# Patient Record
Sex: Female | Born: 1935 | Race: White | Hispanic: No | Smoking: Never smoker
Health system: Southern US, Community
[De-identification: ages and names within clinical notes are randomized; demographics above are authoritative.]

## PROBLEM LIST (undated history)

## (undated) DIAGNOSIS — Z923 Personal history of irradiation: Secondary | ICD-10-CM

## (undated) DIAGNOSIS — G2581 Restless legs syndrome: Secondary | ICD-10-CM

## (undated) DIAGNOSIS — I10 Essential (primary) hypertension: Secondary | ICD-10-CM

## (undated) DIAGNOSIS — N920 Excessive and frequent menstruation with regular cycle: Secondary | ICD-10-CM

## (undated) DIAGNOSIS — C50919 Malignant neoplasm of unspecified site of unspecified female breast: Secondary | ICD-10-CM

## (undated) DIAGNOSIS — M722 Plantar fascial fibromatosis: Secondary | ICD-10-CM

## (undated) DIAGNOSIS — N393 Stress incontinence (female) (male): Secondary | ICD-10-CM

## (undated) DIAGNOSIS — M6530 Trigger finger, unspecified finger: Secondary | ICD-10-CM

## (undated) DIAGNOSIS — R0602 Shortness of breath: Secondary | ICD-10-CM

## (undated) DIAGNOSIS — M810 Age-related osteoporosis without current pathological fracture: Secondary | ICD-10-CM

## (undated) DIAGNOSIS — M7750 Other enthesopathy of unspecified foot: Secondary | ICD-10-CM

## (undated) DIAGNOSIS — M7070 Other bursitis of hip, unspecified hip: Secondary | ICD-10-CM

## (undated) DIAGNOSIS — G43909 Migraine, unspecified, not intractable, without status migrainosus: Secondary | ICD-10-CM

## (undated) DIAGNOSIS — K8020 Calculus of gallbladder without cholecystitis without obstruction: Secondary | ICD-10-CM

## (undated) HISTORY — DX: Other enthesopathy of unspecified foot and ankle: M77.50

## (undated) HISTORY — DX: Plantar fascial fibromatosis: M72.2

## (undated) HISTORY — DX: Stress incontinence (female) (male): N39.3

## (undated) HISTORY — PX: BLADDER SURGERY: SHX569

## (undated) HISTORY — DX: Restless legs syndrome: G25.81

## (undated) HISTORY — DX: Other bursitis of hip, unspecified hip: M70.70

## (undated) HISTORY — PX: CHOLECYSTECTOMY: SHX55

## (undated) HISTORY — DX: Trigger finger, unspecified finger: M65.30

## (undated) HISTORY — DX: Age-related osteoporosis without current pathological fracture: M81.0

## (undated) HISTORY — DX: Essential (primary) hypertension: I10

## (undated) HISTORY — PX: BREAST MASS EXCISION: SHX1267

## (undated) HISTORY — DX: Migraine, unspecified, not intractable, without status migrainosus: G43.909

## (undated) HISTORY — DX: Malignant neoplasm of unspecified site of unspecified female breast: C50.919

## (undated) HISTORY — PX: ABDOMINAL HYSTERECTOMY: SHX81

## (undated) HISTORY — DX: Calculus of gallbladder without cholecystitis without obstruction: K80.20

## (undated) HISTORY — PX: SHOULDER SURGERY: SHX246

## (undated) HISTORY — DX: Excessive and frequent menstruation with regular cycle: N92.0

---

## 1999-05-28 ENCOUNTER — Encounter: Admission: RE | Admit: 1999-05-28 | Discharge: 1999-05-28 | Payer: Self-pay | Admitting: Internal Medicine

## 1999-05-28 ENCOUNTER — Encounter: Payer: Self-pay | Admitting: Internal Medicine

## 1999-05-29 ENCOUNTER — Ambulatory Visit (HOSPITAL_COMMUNITY): Admission: RE | Admit: 1999-05-29 | Discharge: 1999-05-29 | Payer: Self-pay | Admitting: Internal Medicine

## 1999-07-12 ENCOUNTER — Encounter: Admission: RE | Admit: 1999-07-12 | Discharge: 1999-07-12 | Payer: Self-pay | Admitting: Obstetrics and Gynecology

## 1999-07-12 ENCOUNTER — Encounter: Payer: Self-pay | Admitting: Obstetrics and Gynecology

## 2000-08-05 ENCOUNTER — Encounter: Payer: Self-pay | Admitting: Obstetrics and Gynecology

## 2000-08-05 ENCOUNTER — Encounter: Admission: RE | Admit: 2000-08-05 | Discharge: 2000-08-05 | Payer: Self-pay | Admitting: Obstetrics and Gynecology

## 2000-08-11 ENCOUNTER — Encounter: Admission: RE | Admit: 2000-08-11 | Discharge: 2000-08-11 | Payer: Self-pay | Admitting: Obstetrics and Gynecology

## 2000-08-11 ENCOUNTER — Encounter: Payer: Self-pay | Admitting: Obstetrics and Gynecology

## 2001-01-25 ENCOUNTER — Emergency Department (HOSPITAL_COMMUNITY): Admission: EM | Admit: 2001-01-25 | Discharge: 2001-01-25 | Payer: Self-pay | Admitting: Emergency Medicine

## 2001-01-25 ENCOUNTER — Encounter: Payer: Self-pay | Admitting: Emergency Medicine

## 2001-01-26 ENCOUNTER — Encounter (INDEPENDENT_AMBULATORY_CARE_PROVIDER_SITE_OTHER): Payer: Self-pay | Admitting: *Deleted

## 2001-01-26 ENCOUNTER — Ambulatory Visit (HOSPITAL_COMMUNITY): Admission: RE | Admit: 2001-01-26 | Discharge: 2001-01-27 | Payer: Self-pay | Admitting: General Surgery

## 2001-01-26 ENCOUNTER — Encounter (HOSPITAL_BASED_OUTPATIENT_CLINIC_OR_DEPARTMENT_OTHER): Payer: Self-pay | Admitting: General Surgery

## 2001-09-13 ENCOUNTER — Encounter: Admission: RE | Admit: 2001-09-13 | Discharge: 2001-09-13 | Payer: Self-pay | Admitting: Obstetrics and Gynecology

## 2001-09-13 ENCOUNTER — Encounter: Payer: Self-pay | Admitting: Obstetrics and Gynecology

## 2002-07-21 ENCOUNTER — Encounter: Payer: Self-pay | Admitting: Internal Medicine

## 2002-07-21 ENCOUNTER — Encounter: Admission: RE | Admit: 2002-07-21 | Discharge: 2002-07-21 | Payer: Self-pay | Admitting: Internal Medicine

## 2003-02-01 ENCOUNTER — Encounter: Admission: RE | Admit: 2003-02-01 | Discharge: 2003-02-01 | Payer: Self-pay | Admitting: Obstetrics and Gynecology

## 2003-02-01 ENCOUNTER — Encounter: Payer: Self-pay | Admitting: Obstetrics and Gynecology

## 2003-07-15 ENCOUNTER — Ambulatory Visit (HOSPITAL_COMMUNITY): Admission: RE | Admit: 2003-07-15 | Discharge: 2003-07-15 | Payer: Self-pay | Admitting: Orthopedic Surgery

## 2003-08-24 ENCOUNTER — Ambulatory Visit (HOSPITAL_BASED_OUTPATIENT_CLINIC_OR_DEPARTMENT_OTHER): Admission: RE | Admit: 2003-08-24 | Discharge: 2003-08-24 | Payer: Self-pay | Admitting: Orthopedic Surgery

## 2008-05-29 ENCOUNTER — Inpatient Hospital Stay (HOSPITAL_COMMUNITY): Admission: EM | Admit: 2008-05-29 | Discharge: 2008-06-02 | Payer: Self-pay | Admitting: Emergency Medicine

## 2008-05-31 ENCOUNTER — Encounter (INDEPENDENT_AMBULATORY_CARE_PROVIDER_SITE_OTHER): Payer: Self-pay | Admitting: Gastroenterology

## 2009-12-25 ENCOUNTER — Ambulatory Visit: Payer: Self-pay | Admitting: Oncology

## 2009-12-26 ENCOUNTER — Encounter: Admission: RE | Admit: 2009-12-26 | Discharge: 2009-12-26 | Payer: Self-pay | Admitting: Internal Medicine

## 2010-01-02 LAB — COMPREHENSIVE METABOLIC PANEL
ALT: 24 U/L (ref 0–35)
AST: 19 U/L (ref 0–37)
Albumin: 4.5 g/dL (ref 3.5–5.2)
Calcium: 9.7 mg/dL (ref 8.4–10.5)
Chloride: 100 mEq/L (ref 96–112)
Potassium: 4.2 mEq/L (ref 3.5–5.3)
Sodium: 137 mEq/L (ref 135–145)

## 2010-01-02 LAB — CBC WITH DIFFERENTIAL/PLATELET
BASO%: 0.7 % (ref 0.0–2.0)
EOS%: 1.6 % (ref 0.0–7.0)
HGB: 13.2 g/dL (ref 11.6–15.9)
MCH: 31.1 pg (ref 25.1–34.0)
MCHC: 34.4 g/dL (ref 31.5–36.0)
RBC: 4.23 10*6/uL (ref 3.70–5.45)
RDW: 12.8 % (ref 11.2–14.5)
lymph#: 1.9 10*3/uL (ref 0.9–3.3)

## 2010-01-11 ENCOUNTER — Ambulatory Visit (HOSPITAL_COMMUNITY): Admission: RE | Admit: 2010-01-11 | Discharge: 2010-01-11 | Payer: Self-pay | Admitting: Oncology

## 2010-01-16 ENCOUNTER — Ambulatory Visit: Admission: RE | Admit: 2010-01-16 | Discharge: 2010-02-08 | Payer: Self-pay | Admitting: Radiation Oncology

## 2010-03-01 ENCOUNTER — Ambulatory Visit: Payer: Self-pay | Admitting: Oncology

## 2010-03-05 LAB — CBC WITH DIFFERENTIAL/PLATELET
BASO%: 0.7 % (ref 0.0–2.0)
EOS%: 1.3 % (ref 0.0–7.0)
MCH: 31.3 pg (ref 25.1–34.0)
MCHC: 35 g/dL (ref 31.5–36.0)
RDW: 12.8 % (ref 11.2–14.5)
lymph#: 2.2 10*3/uL (ref 0.9–3.3)

## 2010-03-06 LAB — COMPREHENSIVE METABOLIC PANEL
ALT: 31 U/L (ref 0–35)
AST: 24 U/L (ref 0–37)
Albumin: 4.3 g/dL (ref 3.5–5.2)
Calcium: 9.6 mg/dL (ref 8.4–10.5)
Chloride: 100 mEq/L (ref 96–112)
Creatinine, Ser: 1.29 mg/dL — ABNORMAL HIGH (ref 0.40–1.20)
Potassium: 3.8 mEq/L (ref 3.5–5.3)
Sodium: 137 mEq/L (ref 135–145)

## 2010-04-16 ENCOUNTER — Ambulatory Visit: Payer: Self-pay | Admitting: Oncology

## 2010-04-18 LAB — CBC WITH DIFFERENTIAL/PLATELET
Basophils Absolute: 0 10*3/uL (ref 0.0–0.1)
EOS%: 1.5 % (ref 0.0–7.0)
LYMPH%: 25.7 % (ref 14.0–49.7)
MCH: 31.3 pg (ref 25.1–34.0)
MCV: 89.8 fL (ref 79.5–101.0)
MONO%: 8.8 % (ref 0.0–14.0)
Platelets: 330 10*3/uL (ref 145–400)
RBC: 4.04 10*6/uL (ref 3.70–5.45)
RDW: 12.6 % (ref 11.2–14.5)

## 2010-04-18 LAB — COMPREHENSIVE METABOLIC PANEL
AST: 25 U/L (ref 0–37)
Albumin: 4.4 g/dL (ref 3.5–5.2)
Alkaline Phosphatase: 71 U/L (ref 39–117)
BUN: 25 mg/dL — ABNORMAL HIGH (ref 6–23)
Potassium: 3.8 mEq/L (ref 3.5–5.3)
Total Bilirubin: 0.3 mg/dL (ref 0.3–1.2)

## 2010-05-19 DIAGNOSIS — Z923 Personal history of irradiation: Secondary | ICD-10-CM

## 2010-05-19 HISTORY — PX: BREAST LUMPECTOMY: SHX2

## 2010-05-19 HISTORY — DX: Personal history of irradiation: Z92.3

## 2010-06-05 ENCOUNTER — Encounter
Admission: RE | Admit: 2010-06-05 | Discharge: 2010-06-05 | Payer: Self-pay | Source: Home / Self Care | Attending: General Surgery | Admitting: General Surgery

## 2010-06-08 ENCOUNTER — Encounter: Payer: Self-pay | Admitting: Orthopedic Surgery

## 2010-06-17 ENCOUNTER — Other Ambulatory Visit (HOSPITAL_COMMUNITY): Payer: Self-pay | Admitting: General Surgery

## 2010-06-17 DIAGNOSIS — C50919 Malignant neoplasm of unspecified site of unspecified female breast: Secondary | ICD-10-CM

## 2010-06-20 ENCOUNTER — Encounter (HOSPITAL_BASED_OUTPATIENT_CLINIC_OR_DEPARTMENT_OTHER)
Admission: RE | Admit: 2010-06-20 | Discharge: 2010-06-20 | Disposition: A | Discharge status: Home / Self Care | Payer: Medicare Other | Source: Ambulatory Visit | Attending: General Surgery | Admitting: General Surgery

## 2010-06-20 DIAGNOSIS — Z01812 Encounter for preprocedural laboratory examination: Secondary | ICD-10-CM | POA: Insufficient documentation

## 2010-06-20 DIAGNOSIS — Z01810 Encounter for preprocedural cardiovascular examination: Secondary | ICD-10-CM | POA: Insufficient documentation

## 2010-06-20 LAB — BASIC METABOLIC PANEL
BUN: 19 mg/dL (ref 6–23)
Calcium: 9.2 mg/dL (ref 8.4–10.5)
Creatinine, Ser: 1.02 mg/dL (ref 0.4–1.2)
GFR calc non Af Amer: 53 mL/min — ABNORMAL LOW (ref >60)
Glucose, Bld: 110 mg/dL — ABNORMAL HIGH (ref 70–99)
Sodium: 136 mEq/L (ref 135–145)

## 2010-06-20 LAB — DIFFERENTIAL
Basophils Absolute: 0.1 10*3/uL (ref 0.0–0.1)
Eosinophils Relative: 1 % (ref 0–5)
Lymphocytes Relative: 35 % (ref 12–46)
Monocytes Absolute: 0.5 10*3/uL (ref 0.1–1.0)

## 2010-06-20 LAB — CBC
HCT: 36.2 % (ref 36.0–46.0)
MCHC: 33.4 g/dL (ref 30.0–36.0)
RDW: 12.5 % (ref 11.5–15.5)

## 2010-06-24 ENCOUNTER — Ambulatory Visit (HOSPITAL_COMMUNITY)
Admission: RE | Admit: 2010-06-24 | Discharge: 2010-06-24 | Disposition: A | Discharge status: Home / Self Care | Payer: Medicare Other | Source: Ambulatory Visit | Attending: General Surgery | Admitting: General Surgery

## 2010-06-24 ENCOUNTER — Other Ambulatory Visit: Payer: Self-pay | Admitting: General Surgery

## 2010-06-24 ENCOUNTER — Ambulatory Visit (HOSPITAL_BASED_OUTPATIENT_CLINIC_OR_DEPARTMENT_OTHER)
Admission: RE | Admit: 2010-06-24 | Discharge: 2010-06-24 | Disposition: A | Discharge status: Home / Self Care | Payer: Medicare Other | Source: Ambulatory Visit | Attending: General Surgery | Admitting: General Surgery

## 2010-06-24 DIAGNOSIS — C50919 Malignant neoplasm of unspecified site of unspecified female breast: Secondary | ICD-10-CM

## 2010-06-24 DIAGNOSIS — C50519 Malignant neoplasm of lower-outer quadrant of unspecified female breast: Secondary | ICD-10-CM | POA: Insufficient documentation

## 2010-06-24 MED ORDER — TECHNETIUM TC 99M SULFUR COLLOID FILTERED
1.0000 | Freq: Once | INTRAVENOUS | Status: AC | PRN
  Administered 2010-06-24: 1 via INTRADERMAL

## 2010-06-30 NOTE — Op Note (Addendum)
[name]                  ACCOUNT NO.:  [id number]  MEDICAL RECORD NO.:  [id number]           PATIENT TYPE:  LOCATION:                                 FACILITY:  PHYSICIAN:  Juanetta Gosling, MDDATE OF BIRTH:  01-15-1936  DATE OF PROCEDURE: DATE OF DISCHARGE:                              OPERATIVE REPORT   PREOPERATIVE DIAGNOSIS:  Clinical stage II left breast cancer status post neoadjuvant endocrine therapy.  POSTOPERATIVE DIAGNOSIS:  Clinical stage II left breast cancer status post neoadjuvant endocrine therapy.  PROCEDURES: 1. Left breast wire-guided lumpectomy. 2. Injection of methylene blue dye for sentinel lymph node     identification. 3. Left axillary sentinel node biopsy.  SURGEON:  Juanetta Gosling, MD.  ASSISTANT:  None.  ANESTHESIOLOGIST:  Zenon Mayo, MD  SPECIMENS: 1. Left breast tissue marked with paint kit. 2. Left axillary sentinel node with a count of 305.  DISPOSITION OF SPECIMENS:  Pathology.  ESTIMATED BLOOD LOSS:  Minimal.  COMPLICATIONS:  None.  DRAINS:  None.  DISPOSITION:  To recovery room in stable condition.  INDICATIONS:  [name] is a 75 year old female.  I initially saw her in August 2011 after screening mammogram showed an abnormality in the left breast.  She has undergone neoadjuvant Femara with Dr. Pierce Crane.  She has had a Good clinical response to this.  Her MRI response has been fairly decent as well.  We discussed taking her to the operating room, and she and I discussed breast conservation therapy with the risks and benefits associated with that procedure.  PROCEDURE: After informed consent was obtained, the patient was first taken to the breast center.  I discussed with Dr. Yolanda Bonine placing the wires beforehand, and there was a smaller ultrasound abnormality but a little bit larger MRI and mammographic abnormality and two wires were placed to bracket this area with the clip being right at the  center of these areas.  She was then brought to [facility].  She was administered 400 mg of IV ciprofloxacin due to a PENICILLIN allergy.  She then underwent administration of technetium in a periareolar fashion.  She was then taken to the operating room, placed under general anesthesia with an LMA.  Her left breast and axilla were then prepped and draped in a standard sterile surgical fashion.  A surgical time-out was then performed.  The wires were both in her lower outer quadrant of her breast.  I made a radial incision just away from the wires and then brought the wires in from their remote position into the incision.  I then used cautery to excise the area but that just outside of the wires and the entire area between the wires in one single specimen.  This was taken down all the way to the pectoralis muscle including the pectoralis fascia.  Faxitron mammogram was then taken which confirmed removal of the clip, as well as the entire mammographic density, and it was right in the middle of the specimen.  I then placed two sponges in this cavity and then approached the axilla.   I made  a 2-cm incision just below the hairline in her axilla.  I used cautery to go through the axillary fascia.  I used the Neoprobe to identify one sentinel node with a count of 305, the background count was less than 5.  Upon completion, I could not identify any blue dye in the axilla.  I then placed a sponge in the axilla. There was a small area that was bleeding that I controlled with cautery. I then lifted the breast off the pectoralis muscle and then closed the deep layer of the breast tissue with 2-0 Vicryl.  I placed two clips in the deep position, and I placed one clip in each cardinal position of the lumpectomy cavity.  I then closed the breast in multiple layers and used a 4-0 Monocryl to close the skin.  I then took my sponge out of the axilla.  There was no other bleeding.  I closed the  axillary fascia with a 2-0 Vicryl, the dermis with a 3-0 Vicryl, the skin with a 4-0 Monocryl in a subcuticular fashion.  I injected 20 mL of 0.25% Marcaine upon completion.  I placed Benzoin, Steri-Strips, and sterile dressings overlying both of these incisions.  She tolerated this well, was extubated in the operating room, and transferred to recovery room in stable condition.     Juanetta Gosling, MD     MCW/MEDQ  D:  06/24/2010  T:  06/25/2010  Job:  [id number]  cc:   Pierce Crane, MD Theressa Millard, M.D.  Electronically Signed by [name] MD on 06/29/2010 03:08:56 PM

## 2010-07-11 ENCOUNTER — Ambulatory Visit (HOSPITAL_COMMUNITY): Payer: Medicare Other

## 2010-07-11 ENCOUNTER — Ambulatory Visit (HOSPITAL_COMMUNITY)
Admission: RE | Admit: 2010-07-11 | Discharge: 2010-07-11 | Disposition: A | Discharge status: Home / Self Care | Payer: Medicare Other | Source: Ambulatory Visit | Attending: General Surgery | Admitting: General Surgery

## 2010-07-11 ENCOUNTER — Other Ambulatory Visit: Payer: Self-pay | Admitting: General Surgery

## 2010-07-11 DIAGNOSIS — Z01812 Encounter for preprocedural laboratory examination: Secondary | ICD-10-CM | POA: Insufficient documentation

## 2010-07-11 DIAGNOSIS — Z79899 Other long term (current) drug therapy: Secondary | ICD-10-CM | POA: Insufficient documentation

## 2010-07-11 DIAGNOSIS — C50919 Malignant neoplasm of unspecified site of unspecified female breast: Secondary | ICD-10-CM | POA: Insufficient documentation

## 2010-07-11 DIAGNOSIS — Z01818 Encounter for other preprocedural examination: Secondary | ICD-10-CM | POA: Insufficient documentation

## 2010-07-11 DIAGNOSIS — I10 Essential (primary) hypertension: Secondary | ICD-10-CM | POA: Insufficient documentation

## 2010-07-11 LAB — CBC
HCT: 35.7 % — ABNORMAL LOW (ref 36.0–46.0)
Hemoglobin: 12 g/dL (ref 12.0–15.0)
MCH: 30.2 pg (ref 26.0–34.0)
MCHC: 33.6 g/dL (ref 30.0–36.0)
MCV: 89.9 fL (ref 78.0–100.0)
RBC: 3.97 MIL/uL (ref 3.87–5.11)

## 2010-07-11 LAB — BASIC METABOLIC PANEL
BUN: 21 mg/dL (ref 6–23)
CO2: 25 mEq/L (ref 19–32)
Calcium: 9.6 mg/dL (ref 8.4–10.5)
Chloride: 102 mEq/L (ref 96–112)
Creatinine, Ser: 1.01 mg/dL (ref 0.4–1.2)
GFR calc Af Amer: >60 mL/min (ref >60)
Glucose, Bld: 105 mg/dL — ABNORMAL HIGH (ref 70–99)

## 2010-07-17 NOTE — Op Note (Signed)
NAMEJAYLEI, [name]                  ACCOUNT NO.:  [id number]  MEDICAL RECORD NO.:  [id number]           PATIENT TYPE:  O  LOCATION:  SDSC                         FACILITY:  [facility]  PHYSICIAN:  Juanetta Gosling, MDDATE OF BIRTH:  07/03/1935  DATE OF PROCEDURE: DATE OF DISCHARGE:  07/11/2010                              OPERATIVE REPORT   PREOPERATIVE DIAGNOSIS:  Stage I left breast cancer with positive superior margin after lumpectomy and sentinel node biopsy.  POSTOPERATIVE DIAGNOSIS:  Stage I left breast cancer with positive superior margin after lumpectomy and sentinel node biopsy.  PROCEDURE:  Re-excision left breast superior margin.  SURGEON:  Juanetta Gosling, MD  ASSISTANT:  None.  ANESTHESIA:  General.  SPECIMENS:  Left breast superior margin marked with paint kit.  SPECIMEN:  Pathology.  ESTIMATED BLOOD LOSS:  Minimal.  COMPLICATIONS:  None.  DRAINS:  None.  DISPOSITION:  To recovery room in stable condition.  INDICATIONS:  Mrs. [name] is a 75 year old female who was noted to have a left breast cancer and underwent neoadjuvant Femara, I then took to the operating room on June 24, 2010, did a left breast wire-guided lumpectomy with brackets and a sentinel node biopsy.  Her pathology returned as a stage I left breast cancer with 3 foci of invasive carcinoma and associated DCIS, which focally involved her superior margin.  She and I discussed her options at this point and being a re- excision attempt at a lumpectomy versus mastectomy.  Both of [place] agreed upon trying to re-excise this to get clear margins and we discussed re- excising her superior margin.  PROCEDURE:  After informed consent was obtained and the patient was taken to the operative room.  She was administered 1 g of intravenous cefazolin.  Sequential compression devices were placed on her lower extremities prior to induction of anesthesia, she was then placed under general anesthesia with an  LMA without complication.  Her left breast was then prepped and draped in standard sterile surgical fashion. Surgical time-out was then performed.  I reentered her old incision.  I had closed a lot of her breast tissue down in the cavity.  I then removed all these stitches.  I was unable to identify the area where superior margin was, I grasped this with Allis clamps, used cautery to remove the entire superior margin.  I then placed the clip on the superior margin as well as two other clips on the deep margin as well.  I used the standard paint kit to mark the specimen.  I then irrigated and observed hemostasis.  I then closed down the breast and multiple layers with 2-0 Vicryl, 3-0 Vicryl, and 4-0 Monocryl to close the skin.  Steri-Strips and sterile dressing were placed over this.  A breast binder was then placed overlying all of this.  She tolerated this well, was extubated in the operating room and transferred to the recovery room in stable condition.     Juanetta Gosling, MD     MCW/MEDQ  D:  07/11/2010  T:  07/11/2010  Job:  [id number]  cc:   Theron Arista  Donnie Coffin, MD Theressa Millard, M.D.  Electronically Signed by [name] MD on 07/17/2010 04:07:21 PM

## 2010-08-07 ENCOUNTER — Ambulatory Visit: Payer: Medicare Other | Attending: Radiation Oncology | Admitting: Radiation Oncology

## 2010-08-07 DIAGNOSIS — C50919 Malignant neoplasm of unspecified site of unspecified female breast: Secondary | ICD-10-CM | POA: Insufficient documentation

## 2010-08-07 DIAGNOSIS — Z170 Estrogen receptor positive status [ER+]: Secondary | ICD-10-CM | POA: Insufficient documentation

## 2010-08-07 DIAGNOSIS — Z510 Encounter for antineoplastic radiation therapy: Secondary | ICD-10-CM | POA: Insufficient documentation

## 2010-08-22 ENCOUNTER — Encounter (HOSPITAL_BASED_OUTPATIENT_CLINIC_OR_DEPARTMENT_OTHER): Payer: Medicare Other | Admitting: Oncology

## 2010-08-22 ENCOUNTER — Other Ambulatory Visit: Payer: Self-pay | Admitting: Oncology

## 2010-08-22 DIAGNOSIS — Z170 Estrogen receptor positive status [ER+]: Secondary | ICD-10-CM

## 2010-08-22 DIAGNOSIS — C50919 Malignant neoplasm of unspecified site of unspecified female breast: Secondary | ICD-10-CM

## 2010-08-22 LAB — COMPREHENSIVE METABOLIC PANEL
ALT: 32 U/L (ref 0–35)
Albumin: 4.4 g/dL (ref 3.5–5.2)
CO2: 29 mEq/L (ref 19–32)
Calcium: 10.1 mg/dL (ref 8.4–10.5)
Chloride: 98 mEq/L (ref 96–112)
Potassium: 3.5 mEq/L (ref 3.5–5.3)
Sodium: 138 mEq/L (ref 135–145)
Total Bilirubin: 0.3 mg/dL (ref 0.3–1.2)
Total Protein: 6.6 g/dL (ref 6.0–8.3)

## 2010-08-22 LAB — CBC WITH DIFFERENTIAL/PLATELET
BASO%: 0.5 % (ref 0.0–2.0)
Eosinophils Absolute: 0.1 10*3/uL (ref 0.0–0.5)
MCHC: 34 g/dL (ref 31.5–36.0)
MONO#: 0.6 10*3/uL (ref 0.1–0.9)
NEUT#: 6.3 10*3/uL (ref 1.5–6.5)
RBC: 4.34 10*6/uL (ref 3.70–5.45)
WBC: 8.7 10*3/uL (ref 3.9–10.3)
lymph#: 1.7 10*3/uL (ref 0.9–3.3)

## 2010-09-02 LAB — CBC
HCT: 29.2 % — ABNORMAL LOW (ref 36.0–46.0)
HCT: 30.8 % — ABNORMAL LOW (ref 36.0–46.0)
Hemoglobin: 10.3 g/dL — ABNORMAL LOW (ref 12.0–15.0)
Hemoglobin: 9.9 g/dL — ABNORMAL LOW (ref 12.0–15.0)
MCHC: 33.3 g/dL (ref 30.0–36.0)
MCHC: 34 g/dL (ref 30.0–36.0)
MCV: 89.1 fL (ref 78.0–100.0)
MCV: 89.6 fL (ref 78.0–100.0)
MCV: 90.7 fL (ref 78.0–100.0)
Platelets: 237 10*3/uL (ref 150–400)
Platelets: 246 10*3/uL (ref 150–400)
Platelets: 324 10*3/uL (ref 150–400)
RBC: 3.28 MIL/uL — ABNORMAL LOW (ref 3.87–5.11)
RBC: 3.4 MIL/uL — ABNORMAL LOW (ref 3.87–5.11)
RDW: 13.1 % (ref 11.5–15.5)
RDW: 13.2 % (ref 11.5–15.5)
WBC: 15.2 10*3/uL — ABNORMAL HIGH (ref 4.0–10.5)
WBC: 6.4 10*3/uL (ref 4.0–10.5)
WBC: 7.6 10*3/uL (ref 4.0–10.5)

## 2010-09-02 LAB — URINE CULTURE: Colony Count: >=100000

## 2010-09-02 LAB — BASIC METABOLIC PANEL
BUN: 3 mg/dL — ABNORMAL LOW (ref 6–23)
BUN: 6 mg/dL (ref 6–23)
CO2: 23 mEq/L (ref 19–32)
CO2: 24 mEq/L (ref 19–32)
Calcium: 7.8 mg/dL — ABNORMAL LOW (ref 8.4–10.5)
Calcium: 7.9 mg/dL — ABNORMAL LOW (ref 8.4–10.5)
Chloride: 109 mEq/L (ref 96–112)
Chloride: 111 mEq/L (ref 96–112)
Creatinine, Ser: 0.8 mg/dL (ref 0.4–1.2)
Creatinine, Ser: 0.84 mg/dL (ref 0.4–1.2)
GFR calc Af Amer: >60 mL/min (ref >60)
GFR calc Af Amer: >60 mL/min (ref >60)
GFR calc non Af Amer: >60 mL/min (ref >60)
GFR calc non Af Amer: >60 mL/min (ref >60)
Glucose, Bld: 86 mg/dL (ref 70–99)
Glucose, Bld: 92 mg/dL (ref 70–99)
Potassium: 3.5 mEq/L (ref 3.5–5.1)
Potassium: 3.8 mEq/L (ref 3.5–5.1)
Sodium: 139 mEq/L (ref 135–145)
Sodium: 140 mEq/L (ref 135–145)

## 2010-09-02 LAB — CLOSTRIDIUM DIFFICILE EIA
C difficile Toxins A+B, EIA: NEGATIVE
C difficile Toxins A+B, EIA: NEGATIVE

## 2010-09-02 LAB — URINE MICROSCOPIC-ADD ON

## 2010-09-02 LAB — URINALYSIS, ROUTINE W REFLEX MICROSCOPIC
Glucose, UA: NEGATIVE mg/dL
Ketones, ur: NEGATIVE mg/dL
pH: 6 (ref 5.0–8.0)

## 2010-09-02 LAB — DIFFERENTIAL
Basophils Absolute: 0 10*3/uL (ref 0.0–0.1)
Eosinophils Relative: 0 % (ref 0–5)
Lymphocytes Relative: 6 % — ABNORMAL LOW (ref 12–46)
Lymphs Abs: 1 10*3/uL (ref 0.7–4.0)
Monocytes Absolute: 1.4 10*3/uL — ABNORMAL HIGH (ref 0.1–1.0)

## 2010-09-02 LAB — PHOSPHORUS: Phosphorus: 1.6 mg/dL — ABNORMAL LOW (ref 2.3–4.6)

## 2010-09-02 LAB — STOOL CULTURE

## 2010-09-02 LAB — COMPREHENSIVE METABOLIC PANEL
AST: 25 U/L (ref 0–37)
Albumin: 3.3 g/dL — ABNORMAL LOW (ref 3.5–5.2)
Chloride: 94 mEq/L — ABNORMAL LOW (ref 96–112)
Creatinine, Ser: 1.08 mg/dL (ref 0.4–1.2)
GFR calc Af Amer: >60 mL/min (ref >60)
Sodium: 131 mEq/L — ABNORMAL LOW (ref 135–145)
Total Bilirubin: 0.8 mg/dL (ref 0.3–1.2)

## 2010-09-02 LAB — CULTURE, BLOOD (ROUTINE X 2): Culture: NO GROWTH

## 2010-09-02 LAB — MAGNESIUM: Magnesium: 2 mg/dL (ref 1.5–2.5)

## 2010-10-01 NOTE — Op Note (Signed)
[name], [name]                  ACCOUNT NO.:  [id number]   MEDICAL RECORD NO.:  [id number]          PATIENT TYPE:  INP   LOCATION:  2035                         FACILITY:  [facility]   PHYSICIAN:  John C. Madilyn Fireman, M.D.    DATE OF BIRTH:  1936-01-05   DATE OF PROCEDURE:  05/31/2008  DATE OF DISCHARGE:                               OPERATIVE REPORT   INDICATIONS FOR PROCEDURE:  Unrelenting diarrhea, uncertain etiology.   PROCEDURE:  The patient was placed in the left lateral decubitus  position and placed on the pulse monitor with continuous low-flow oxygen  delivered by nasal cannula.  She was sedated with 75 mcg IV fentanyl and  6 mg IV Versed.  Olympus video colonoscope was inserted into the rectum  and advanced to the cecum, confirmed by transillumination of McBurney's  point and visualization of ileocecal valve and appendiceal orifice.  The  prep was excellent, despite no prep being given.  The terminal ileum was  intubated and explored for several centimeters and appeared to be within  normal limits.  The cecum and ascending colon appeared normal with no  masses, polyps, diverticula, or other mucosal abnormalities.  Beginning  at about the transverse colon, there were a few areas of mild patchy  erythema overlying rugal folds.  This was somewhat more pronounced than  the sigmoid and rectum.  It appeared at best to represent mild visible  inflammation.  I elected to take biopsies in 2 specimen containers  primarily of normal mucosa in the ascending and transverse colon to rule  out a microscopic colitis.  Additional biopsies were taken of the  erythematous area in the sigmoid and rectum and send the separate  specimen container.  There were no pseudomembranes.  No friability,  granularity, or thickening in any of these areas, and overall the  appearance was very mild and nonspecific.  The scope was then withdrawn  and the patient returned to the recovery room in stable condition.   She  tolerated the procedure well, and there were no immediate complications.   IMPRESSION:  Possible mild colitis, mainly patchy in the sigmoid and  rectosigmoid and descending colon.   PLAN:  Await all biopsy results.           ______________________________  Everardo All Madilyn Fireman, M.D.     JCH/MEDQ  D:  05/31/2008  T:  05/31/2008  Job:  [id number]

## 2010-10-01 NOTE — Discharge Summary (Signed)
NAMEDEVYNN, [name]                  ACCOUNT NO.:  [id number]   MEDICAL RECORD NO.:  [id number]          PATIENT TYPE:  INP   LOCATION:  5532                         FACILITY:  [facility]   PHYSICIAN:  Theressa Millard, M.D.    DATE OF BIRTH:  1935/09/19   DATE OF ADMISSION:  05/28/2008  DATE OF DISCHARGE:  06/02/2008                               DISCHARGE SUMMARY   ADMITTING DIAGNOSES:  Diarrhea and profound dehydration.   DISCHARGE DIAGNOSES:  1. Antibiotic-associated colitis, no evidence of Clostridium      difficile.  2. Profound volume contraction with hypokalemia and hyponatremia.  3. Hypertension.   The patient is a 75 year old white female, who had had severe diarrhea  for 3-4 weeks after several rounds of antibiotics.  She finally came to  the emergency room and was mildly hypotensive.   HOSPITAL COURSE:  The patient underwent a CT scan of the abdomen, which  revealed no significant abnormalities.  She was given IV fluids, which  resulted in improvement in her blood pressure and a general sense of  well being.  A hyponatremia with a sodium of 131 and hypokalemia with a  potassium of 2.9, improved with IV fluids.  C. difficile titer was  checked on 3 different occasions.  The patient was started on  metronidazole.  The patient's symptoms improved, but she continued to  have 6-8 stools per day.  C. difficile stool tests were all negative.  She underwent a colonoscopy, which showed no evidence of pseudomembranes  or significant evidence of inflammation.  Biopsy showed minor  inflammation.  She continued to improve, was able to eat a regular diet,  and was discharged in improved condition.   DISCHARGE MEDICATIONS:  1. Maxzide 25 once daily.  2. Calcium once daily, start in 2 weeks.  3. Multivitamin daily, start in 2 weeks.  4. Metronidazole 3 times a day x4 days.  5. Align per box instructions, available over the counter.   FOLLOWUP:  She will call to make an appointment to see  me in 1-2 weeks.   She will avoid milk products for the next week.      Theressa Millard, M.D.  Electronically Signed     JO/MEDQ  D:  06/02/2008  T:  06/03/2008  Job:  [id number]

## 2010-10-01 NOTE — H&P (Signed)
NAMEJIL, [name] NO.:  [id number]   MEDICAL RECORD NO.:  [id number]          PATIENT TYPE:  EMS   LOCATION:  MAJO                         FACILITY:  [facility]   PHYSICIAN:  Lucita Ferrara, MD         DATE OF BIRTH:  [birth year]   DATE OF ADMISSION:  05/28/2008  DATE OF DISCHARGE:                              HISTORY & PHYSICAL   PRIMARY CARE PHYSICIAN:  Dr. Earl Gala.   CHIEF COMPLAINT:  Persistent diarrhea and dizziness.   HISTORY OF PRESENT ILLNESS:  The patient is a 75 year old female who  presents with persistent diarrhea that started about 3 weeks ago.  Diarrheal symptoms started and were associated with antibiotics that she  had been taking for sinusitis and bronchitis.  Diarrhea is nonbloody,  non-foul-smelling, mucousy.  The patient has had colonoscopies in the  past.  She denies any food predilections.  She denies of clostridium  difficile infection.  She denies recent travel.  She denies any nausea,  vomiting.  Supposedly, she has been taking Augmentin.   ALLERGIES:  NO KNOWN DRUG ALLERGIES.   MEDICATIONS AT HOME:  Triamterene hydrochlorothiazide 37.5/25 once  daily.   PAST MEDICAL HISTORY:  Significant for hypertension.   PAST SURGICAL HISTORY:  1. Status post cholecystectomy.  2. Status post shoulder arthrogram.   SOCIAL HISTORY:  The patient denies drugs, alcohol or tobacco.  Travel  history:  None recently.   REVIEW OF SYSTEMS:  As per HPI, otherwise negative.  She denies any  chest pain, shortness of breath.  She denies hematemesis, hematochezia.  She does have some dizziness and lightheadedness upon ambulation.  She  denies any abdominal pain.   PHYSICAL EXAMINATION:  GENERAL:  The patient is in no acute distress.  Lowest blood pressure documented here was 91/51.  HEENT: Normocephalic, atraumatic.  Sclerae is anicteric.  NECK:  Supple.  No JVD.  Mucous membranes dry.  NECK:  Supple.  CARDIOVASCULAR:  S1, S2, regular rate and rhythm.  No  murmurs, rubs,  clicks.  LUNGS:  Clear to auscultation, without rubs or wheezes.  ABDOMEN:  Soft, nontender, nondistended.  Positive bowel sounds tremors.  EXTREMITIES:  No clubbing, cyanosis or edema.  NEUROLOGICAL:  Patient is oriented and oriented x3.  Cranial nerves II-  XII grossly intact.   EMERGENCY DEPARTMENT COURSE:  The patient could not tolerate ambulation.  She became dizzy and lightheaded.  EKG shows normal sinus rhythm.  Urinalysis large amount of leukocyte esterase.  Complete metabolic panel  shows sodium 131, potassium 2.9, BUN 14, creatinine 1.08.  AST, ALT and  alk phosphatase is within normal limits.  Lipase is 20.  White count  15.2, hemoglobin 13.6, hematocrit 40.9, platelet count 324.   ASSESSMENT/PLAN:  A 75 year old with:  1. Persistent foul-smelling diarrhea with dizziness and light      headedness upon ambulation.  2. Hypertension on triamterene/hydrochlorothiazide.   DISCUSSION:  The patient will be admitted to medical telemetry unit.  Intravenous fluids will be initiated.  The patient will be kept n.p.o.  Stool will be sent out for clostridium  difficile.  The patient should be  on C diff contact precautions.  The patient will be empirically started  on metronidazole 250 mg p.o. every 8 hours.  Will monitor white count.  GI will be consulted as needed.  Will need to hold her blood pressure  medication at this time.  We will go ahead and replete potassium via  intravenous fluids.  The rest of plans depend on progress.      Lucita Ferrara, MD  Electronically Signed     RR/MEDQ  D:  05/29/2008  T:  05/29/2008  Job:  [id number]

## 2010-10-04 NOTE — Op Note (Signed)
NAME:  [name], [name]                            ACCOUNT NO.:  [id number]   MEDICAL RECORD NO.:  [id number]                   PATIENT TYPE:  AMB   LOCATION:  DSC                                  FACILITY:  [facility]   PHYSICIAN:  Katy Fitch. Naaman Plummer., M.D.          DATE OF BIRTH:  02-Jun-1935   DATE OF PROCEDURE:  08/24/2003  DATE OF DISCHARGE:                                 OPERATIVE REPORT   PREOPERATIVE DIAGNOSIS:  Chronic stage III impingement left shoulder with  MRI evidence of full thickness rotator cuff tear of supraspinatus and  chronic acromioclavicular joint arthropathy.   POSTOPERATIVE DIAGNOSIS:  Chronic stage III impingement left shoulder with  MRI evidence of full thickness rotator cuff tear of supraspinatus and  chronic acromioclavicular joint arthropathy.   OPERATION:  1. Diagnostic arthroscopy of left glenohumeral joint followed by     arthroscopic subacromial decompression with coracoacromial ligament     release, bursectomy, and acromioplasty.  2. Open repair of right rotator cuff chronic tear of supraspinatus tendon.  3. Open distal clavicle excision, i.e. Mumford procedure.   SURGEON:  [name], M.D.   ASSISTANT:  Jonni Sanger, P.A.   ANESTHESIA:  General endotracheal anesthesia supplemented by interscalene  block.   ANESTHESIOLOGIST:  Sheldon Silvan, M.D.   INDICATIONS FOR PROCEDURE:  [name] is a 75 year old former employee of  the [employer] who presented for evaluation of chronic left shoulder  pain.  [name] is status post arthroscopic decompression of her right shoulder  with distal clavicle excision in 1999 with a good result.  [name] presented for  evaluation of her left shoulder reporting night pain, weakness of abduction,  and weakness of external rotation.  Clinical examination suggested  impingement.  Plain films documented AC arthropathy and a prominent lateral  acromion with a sclerotic greater tuberosity.  An MRI of her shoulder  documented a full thickness tear of her supraspinatus, a prominent lateral  acromion, and AC arthropathy.  Arrangements are made at this time for  diagnostic arthroscopy with appropriate glenohumeral intervention followed  by subacromial decompression, open distal clavicle resection, and repair of  the rotator cuff.  Prior to surgery, questions were invited and answered.  Dr. Ivin Booty performed a preoperative anesthesia consult and proceeded with a  scalene block in the holding area.  Anesthesia was noted to be excellent in  the left four quarter.   PROCEDURE:  [name] is brought to the operating room and placed in supine  position on the operating table.  Following the induction of general  endotracheal anesthesia, [name] was carefully positioned in the beach chair  position with the aid of a torso and head holder designed for shoulder  arthroscopy.  The entire left upper extremity and four quarter were prepped  with DuraPrep followed by draping with impervious arthroscopy drapes.  The  procedure commenced with placement of the arthroscope through a standard  posterior portal with blunt technique.  Diagnostic arthroscopy revealed  intact hyaline articular cartilage surfaces on the humeral head and glenoid.  The labrum was intact.  The anterior glenohumeral ligaments were intact.  The biceps anchor was solid and the biceps tendon was normal to the rotator  interval.  The subscapularis was normal.  The deep surface of the rotator  cuff revealed an intact capsular membrane.  The supraspinatus defect was  visible through the membrane.  The remainder of the glenohumeral examination  was normal.  The scope was removed from the glenohumeral joint and placed in  the subacromial space.   The subacromial and subdeltoid bursa was moderately hypertrophic.  This was  thoroughly debrided with a suction shaver followed by release of the  coracoacromial ligament.  A bur was used to then level the acromion  to a  type 1 morphology.  The arthroscopic equipment was removed.   An open incision was created from the distal clavicle across the anterior  acromion.  The anterior third of the deltoid was released subperiosteally  exposing the cuff and bursa.  Several loose bodies were removed from the  bursa followed by debridement of the margins of the cuff tear.  The greater  tuberosity profile was lowered 3 mm with a power bur followed by placement  of a 5 mm bio-corkscrew anchor.  A #2 FiberWire suture was placed with  grasping technique and used to advance the supraspinatus to an anatomic  position with Merlinda Frederick type through bone suture anchorage.  The four tails  of the bio-corkscrew anchor were then placed as double mattress sutures  reapproximating the supraspinatus to the decorticated greater tuberosity.  The bursa was then carefully palpated and no other loose bodies were noted.  The distal 18 mm of clavicle was exposed and excised using an oscillating  saw.  A marginal osteophyte along the medial lip of the [facility] joint acromial  side was removed with a rongeur.  A hand rasp was used to smooth the deep  side of the acromion followed by repair of the anterior third of the deltoid  to the trapezius muscle closing the dead space created by distal clavicle  resection and the anterior third of the deltoid was repaired anatomically  with aponeurosis sutures of #2 FiberWire.   The skin was then repaired with subdermal suture of 2-0 Vicryl and  intradermal 3-0 Prolene with Steri-Strips.  There were no apparent  complications.  [name] tolerated the surgery and anesthesia well.  [name]  was transferred to the recovery room with stable vital signs.   For aftercare, [name] will admitted to the recovery care center for observation  of her vital signs and appropriate analgesics in the form of IV PCA morphine  and p.o. and IV Dilaudid.  [name] will be given IV Ancef 1 gram IV q.8h. X 24  hours.                                               Katy Fitch Naaman Plummer., M.D.    RVS/MEDQ  D:  08/24/2003  T:  08/24/2003  Job:  [id number]

## 2010-10-04 NOTE — Op Note (Signed)
[facility]  Patient:    SANDA, DEJOY Visit Number: [id number] MRN: [id number]          Service Type: DSU Location: [facility]CRM 2550 08 Attending Physician:  Sonda Primes Proc. Date: 01/26/01 Admit Date:  01/26/2001   CC:         Luisa Hart L. Lurene Shadow, M.D. x 2   Operative Report  PREOPERATIVE DIAGNOSIS:  Acute cholecystitis.  POSTOPERATIVE DIAGNOSIS:  Acute cholecystitis.  PROCEDURE:  Laparoscopic cholecystectomy with intraoperative cholangiogram.  SURGEON:  Luisa Hart L. Lurene Shadow, M.D.  ASSISTANT:  Marnee Spring. Wiliam Ke, M.D.  ANESTHESIA:  General anesthesia.  INDICATIONS:  The patient is a 75 year old woman presenting with acute epigastric and right upper quadrant pain associated with nausea and vomiting. Gallbladder ultrasound demonstrates cholelithiasis.  Liver function tests and lipase are all within normal limits.  Because of her persistent pain, she is now brought to the operating room for a laparoscopic cholecystectomy.  DESCRIPTION OF PROCEDURE:  Following the induction of satisfactory general anesthesia, with the patient positioned supinely, the abdomen was routinely prepped and draped to be included in a sterile operative field.  Open laparoscopy was created at the umbilicus with insertion of the Hasson cannula and insufflation of the peritoneal cavity with 14 mmHg pressure using carbon dioxide.  The camera was inserted and visual exploration carried out.  The gallbladder was noted to be somewhat hydropic and acutely inflammed.  The duodenal sweep and the anterior gastric wall appeared to be normal.  Liver edges were sharp and liver surfaces smooth.  None of the large and small intestines appeared to be abnormal.   No adnexal structures were visualized.  Under direct vision, epigastric and lateral ports were placed.  The gallbladder is grasped and retracted cephalad and dissection carried down to the region of the ampulla with isolation  of the cystic artery and cystic duct. The cystic artery was traced into its entry into the gallbladder wall.  The cystic duct was traced to the gallbladder and cystic duct junction.  The cystic artery was doubly clipped and transected.  The cystic duct was clipped proximally and opened.  The cystic duct cholangiogram using a Reddick catheter was then carried out by passing the catheter in through the abdomen through a 14 gauge angiocath.  The catheter tip was placed in the cystic duct and held with a clip.  Injection of 1/2 strength Hypaque into the biliary system under fluoroscopic guidance showed free flow of contrast into the duodenum.  No filling defects within the common hepatic ducts, the common bile duct, or upper radicals.  The cystic duct catheter was then removed and the cystic duct doubly clipped and transected.  The gallbladder was dissected free from the liver bed using electrocautery.  At the end of the dissection, all areas of dissection checked for hemostasis and noted to be dry.  Additional areas of bleeding were treated with electrocautery.  The camera was then placed in the epigastric port, the gallbladder was retrieved through the umbilicus port using an endocatch.  The gallbladder was then retrived from the abdominal cavity.  The trocars were removed under direct vision.  The pneumoperitoneum allowed to deflate using valsalva maneuvers.  Sponge, needle, and instrument counts were correct.  The wounds were closed in layers as follows.  The umbilical wound in two layers with 0 Dexon and 4-0 Dexon.  Epigastric and lateral flank wounds closed with 4-0 Dexon.  All wounds were infiltrated with 0.25% Marcaine with epinephrine.  The  wounds were steri-stripped and sterile dressings applied.  The anesthetic reversed.  The patient removed from theoperating room to the recovery room in stable condition.  She tolerated the procedure well. Attending Physician:  Sonda Primes DD:  01/26/01 TD:  01/26/01 Job: [id number]

## 2010-11-06 ENCOUNTER — Ambulatory Visit
Admission: RE | Admit: 2010-11-06 | Discharge: 2010-11-06 | Disposition: A | Discharge status: Home / Self Care | Payer: Medicare Other | Source: Ambulatory Visit | Attending: Radiation Oncology | Admitting: Radiation Oncology

## 2010-12-19 ENCOUNTER — Other Ambulatory Visit: Payer: Self-pay | Admitting: Oncology

## 2010-12-19 ENCOUNTER — Encounter (HOSPITAL_BASED_OUTPATIENT_CLINIC_OR_DEPARTMENT_OTHER): Payer: Medicare Other | Admitting: Oncology

## 2010-12-19 DIAGNOSIS — C50919 Malignant neoplasm of unspecified site of unspecified female breast: Secondary | ICD-10-CM

## 2010-12-19 DIAGNOSIS — C50419 Malignant neoplasm of upper-outer quadrant of unspecified female breast: Secondary | ICD-10-CM

## 2010-12-19 DIAGNOSIS — Z170 Estrogen receptor positive status [ER+]: Secondary | ICD-10-CM

## 2010-12-19 DIAGNOSIS — Z1231 Encounter for screening mammogram for malignant neoplasm of breast: Secondary | ICD-10-CM

## 2010-12-19 LAB — CBC WITH DIFFERENTIAL/PLATELET
BASO%: 0.7 % (ref 0.0–2.0)
EOS%: 1.1 % (ref 0.0–7.0)
HCT: 37.3 % (ref 34.8–46.6)
LYMPH%: 19.7 % (ref 14.0–49.7)
MCH: 31 pg (ref 25.1–34.0)
MCHC: 34.2 g/dL (ref 31.5–36.0)
MCV: 90.9 fL (ref 79.5–101.0)
MONO%: 9.3 % (ref 0.0–14.0)
NEUT%: 69.2 % (ref 38.4–76.8)
Platelets: 281 10*3/uL (ref 145–400)
lymph#: 1 10*3/uL (ref 0.9–3.3)

## 2010-12-19 LAB — COMPREHENSIVE METABOLIC PANEL
ALT: 29 U/L (ref 0–35)
AST: 22 U/L (ref 0–37)
Alkaline Phosphatase: 70 U/L (ref 39–117)
BUN: 20 mg/dL (ref 6–23)
Creatinine, Ser: 1.04 mg/dL (ref 0.50–1.10)
Total Bilirubin: 0.3 mg/dL (ref 0.3–1.2)

## 2011-01-30 ENCOUNTER — Ambulatory Visit
Admission: RE | Admit: 2011-01-30 | Discharge: 2011-01-30 | Disposition: A | Discharge status: Home / Self Care | Payer: Medicare Other | Source: Ambulatory Visit | Attending: Oncology | Admitting: Oncology

## 2011-01-30 ENCOUNTER — Other Ambulatory Visit: Payer: Self-pay | Admitting: Oncology

## 2011-01-30 DIAGNOSIS — Z1231 Encounter for screening mammogram for malignant neoplasm of breast: Secondary | ICD-10-CM

## 2011-01-30 DIAGNOSIS — Z9889 Other specified postprocedural states: Secondary | ICD-10-CM

## 2011-02-13 ENCOUNTER — Ambulatory Visit
Admission: RE | Admit: 2011-02-13 | Discharge: 2011-02-13 | Disposition: A | Discharge status: Home / Self Care | Payer: Medicare Other | Source: Ambulatory Visit | Attending: Oncology | Admitting: Oncology

## 2011-02-13 DIAGNOSIS — Z9889 Other specified postprocedural states: Secondary | ICD-10-CM

## 2011-05-03 ENCOUNTER — Telehealth: Payer: Self-pay | Admitting: Oncology

## 2011-05-03 NOTE — Telephone Encounter (Signed)
called pts home lmovm for appts in feb2013.  asked pt to rtn call to confirm appts

## 2011-05-06 ENCOUNTER — Telehealth: Payer: Self-pay | Admitting: Oncology

## 2011-05-06 NOTE — Telephone Encounter (Signed)
pt called on 12/17 and confirmed appt for feb2013

## 2011-06-09 DIAGNOSIS — M659 Synovitis and tenosynovitis, unspecified: Secondary | ICD-10-CM | POA: Diagnosis not present

## 2011-06-09 DIAGNOSIS — M79609 Pain in unspecified limb: Secondary | ICD-10-CM | POA: Diagnosis not present

## 2011-06-09 DIAGNOSIS — G479 Sleep disorder, unspecified: Secondary | ICD-10-CM | POA: Diagnosis not present

## 2011-06-13 ENCOUNTER — Other Ambulatory Visit: Payer: Self-pay | Admitting: Oncology

## 2011-06-13 ENCOUNTER — Other Ambulatory Visit: Payer: Self-pay | Admitting: *Deleted

## 2011-06-13 DIAGNOSIS — C50919 Malignant neoplasm of unspecified site of unspecified female breast: Secondary | ICD-10-CM

## 2011-06-13 MED ORDER — LETROZOLE 2.5 MG PO TABS: 2.5000 mg | ORAL_TABLET | Freq: Every day | ORAL | Status: AC

## 2011-06-17 DIAGNOSIS — M79609 Pain in unspecified limb: Secondary | ICD-10-CM | POA: Diagnosis not present

## 2011-06-23 DIAGNOSIS — M6530 Trigger finger, unspecified finger: Secondary | ICD-10-CM | POA: Diagnosis not present

## 2011-07-04 ENCOUNTER — Other Ambulatory Visit: Payer: Medicare Other | Admitting: Lab

## 2011-07-04 ENCOUNTER — Ambulatory Visit (HOSPITAL_BASED_OUTPATIENT_CLINIC_OR_DEPARTMENT_OTHER): Payer: Medicare Other | Admitting: Oncology

## 2011-07-04 ENCOUNTER — Other Ambulatory Visit: Payer: Self-pay | Admitting: Oncology

## 2011-07-04 ENCOUNTER — Other Ambulatory Visit: Payer: Self-pay | Admitting: *Deleted

## 2011-07-04 DIAGNOSIS — E559 Vitamin D deficiency, unspecified: Secondary | ICD-10-CM | POA: Diagnosis not present

## 2011-07-04 DIAGNOSIS — C801 Malignant (primary) neoplasm, unspecified: Secondary | ICD-10-CM | POA: Diagnosis not present

## 2011-07-04 DIAGNOSIS — Z79811 Long term (current) use of aromatase inhibitors: Secondary | ICD-10-CM | POA: Diagnosis not present

## 2011-07-04 DIAGNOSIS — C50919 Malignant neoplasm of unspecified site of unspecified female breast: Secondary | ICD-10-CM | POA: Diagnosis not present

## 2011-07-04 DIAGNOSIS — Z170 Estrogen receptor positive status [ER+]: Secondary | ICD-10-CM | POA: Diagnosis not present

## 2011-07-04 LAB — CBC WITH DIFFERENTIAL/PLATELET
Basophils Absolute: 0 10*3/uL (ref 0.0–0.1)
Eosinophils Absolute: 0.1 10*3/uL (ref 0.0–0.5)
HCT: 39.1 % (ref 34.8–46.6)
HGB: 13.3 g/dL (ref 11.6–15.9)
MCH: 31.1 pg (ref 25.1–34.0)
MCV: 91.1 fL (ref 79.5–101.0)
MONO%: 8.3 % (ref 0.0–14.0)
NEUT#: 3.8 10*3/uL (ref 1.5–6.5)
NEUT%: 68.1 % (ref 38.4–76.8)
Platelets: 277 10*3/uL (ref 145–400)
RDW: 12.9 % (ref 11.2–14.5)

## 2011-07-04 LAB — COMPREHENSIVE METABOLIC PANEL
Albumin: 4.3 g/dL (ref 3.5–5.2)
Alkaline Phosphatase: 75 U/L (ref 39–117)
BUN: 21 mg/dL (ref 6–23)
Calcium: 9.4 mg/dL (ref 8.4–10.5)
Chloride: 100 mEq/L (ref 96–112)
Creatinine, Ser: 1.07 mg/dL (ref 0.50–1.10)
Glucose, Bld: 119 mg/dL — ABNORMAL HIGH (ref 70–99)
Potassium: 3.7 mEq/L (ref 3.5–5.3)

## 2011-07-04 NOTE — Progress Notes (Signed)
Hematology and Oncology Follow Up Visit  [name] [id number] 1935/06/19 76 y.o. 07/04/2011 3:12 PM   DIAGNOSIS: 76 year old with history of low-grade advanced ER PR positive breast cancer previously on neoadjuvant Femara therapy followed by surgery on 06/24/2002 with partial response Status post ration therapy to left breast completed 10/03/2010  Encounter Diagnoses  Name Primary?  . Malignant neoplasm of breast (female), unspecified site   . Unspecified vitamin D deficiency      PAST THERAPY:    Interim History:  Patient returns for followup. She has been doing fairly well. She tolerates Femara well. Potassium and she's had some investigations were migratory aches and pains especially in her left leg in order. She is due to see the orthopedic surgeon. She denies any hot flashes or other evidence of myalgias or arthralgias. She is taking her calcium with vitamin D and extra vitamin D as well. In addition she is on Ambien. She is also on letrozole.  Medications: I have reviewed the patient's current medications.  Allergies:  Allergies  Allergen Reactions  . Amoxicillin     Past Medical History, Surgical history, Social history, and Family History were reviewed and updated.  Review of Systems: Constitutional:  Negative for fever, chills, night sweats, anorexia, weight loss, pain. Cardiovascular: negative Respiratory: negative Neurological: negative Dermatological: negative ENT: negative Skin Gastrointestinal: negative Genito-Urinary: negative Hematological and Lymphatic: negative Breast: negative Musculoskeletal: negative Remaining ROS negative.  Physical Exam:  Blood pressure 123/68, pulse 90, temperature 98.3 F (36.8 C), temperature source Oral, weight 182 lb (82.555 kg).  ECOG:    General appearance: alert, cooperative and appears stated age Eyes: conjunctivae/corneas clear. PERRL, EOM's intact. Fundi benign. Throat: lips, mucosa, and tongue normal; teeth  and gums normal Resp: clear to auscultation bilaterally and normal percussion bilaterally Breasts: normal appearance, no masses or tenderness, Inspection negative Cardio: regular rate and rhythm, S1, S2 normal, no murmur, click, rub or gallop and normal apical impulse GI: soft, non-tender; bowel sounds normal; no masses,  no organomegaly Extremities: extremities normal, atraumatic, no cyanosis or edema Lymph nodes: Cervical, supraclavicular, and axillary nodes normal. Neurologic: Alert and oriented X 3, normal strength and tone. Normal symmetric reflexes. Normal coordination and gait   Lab Results: Lab Results  Component Value Date   WBC 5.6 07/04/2011   HGB 13.3 07/04/2011   HCT 39.1 07/04/2011   MCV 91.1 07/04/2011   PLT 277 07/04/2011     Chemistry      Component Value Date/Time   NA 135 12/19/2010 1307   NA 135 12/19/2010 1307   K 4.0 12/19/2010 1307   K 4.0 12/19/2010 1307   CL 98 12/19/2010 1307   CL 98 12/19/2010 1307   CO2 30 12/19/2010 1307   CO2 30 12/19/2010 1307   BUN 20 12/19/2010 1307   BUN 20 12/19/2010 1307   CREATININE 1.04 12/19/2010 1307   CREATININE 1.04 12/19/2010 1307      Component Value Date/Time   CALCIUM 10.2 12/19/2010 1307   CALCIUM 10.2 12/19/2010 1307   ALKPHOS 70 12/19/2010 1307   ALKPHOS 70 12/19/2010 1307   AST 22 12/19/2010 1307   AST 22 12/19/2010 1307   ALT 29 12/19/2010 1307   ALT 29 12/19/2010 1307   BILITOT 0.3 12/19/2010 1307   BILITOT 0.3 12/19/2010 1307       Radiological Studies:  No results found.   IMPRESSIONS AND PLAN: A 75 y.o. female with   History of locally advanced ER PR positive breast cancer currently  on Femara. I will see her in 6 months time for followup as well as imaging studies. Lab work today looks normal.  Spent more than half the time coordinating care.    Birney Belshe 2/15/20133:12 PM

## 2011-07-07 ENCOUNTER — Telehealth: Payer: Self-pay | Admitting: *Deleted

## 2011-07-07 NOTE — Telephone Encounter (Signed)
gave patient appointment for 12-2011 printed out calendar and gave to the patient 

## 2011-07-10 DIAGNOSIS — M25579 Pain in unspecified ankle and joints of unspecified foot: Secondary | ICD-10-CM | POA: Diagnosis not present

## 2011-07-10 DIAGNOSIS — M76899 Other specified enthesopathies of unspecified lower limb, excluding foot: Secondary | ICD-10-CM | POA: Diagnosis not present

## 2011-07-14 DIAGNOSIS — M6530 Trigger finger, unspecified finger: Secondary | ICD-10-CM | POA: Diagnosis not present

## 2011-07-17 DIAGNOSIS — M19079 Primary osteoarthritis, unspecified ankle and foot: Secondary | ICD-10-CM | POA: Diagnosis not present

## 2011-07-29 DIAGNOSIS — M76829 Posterior tibial tendinitis, unspecified leg: Secondary | ICD-10-CM | POA: Diagnosis not present

## 2011-08-05 DIAGNOSIS — H43399 Other vitreous opacities, unspecified eye: Secondary | ICD-10-CM | POA: Diagnosis not present

## 2011-08-05 DIAGNOSIS — H2510 Age-related nuclear cataract, unspecified eye: Secondary | ICD-10-CM | POA: Diagnosis not present

## 2011-08-05 DIAGNOSIS — H524 Presbyopia: Secondary | ICD-10-CM | POA: Diagnosis not present

## 2011-08-20 DIAGNOSIS — Z1331 Encounter for screening for depression: Secondary | ICD-10-CM | POA: Diagnosis not present

## 2011-08-20 DIAGNOSIS — I10 Essential (primary) hypertension: Secondary | ICD-10-CM | POA: Diagnosis not present

## 2011-08-20 DIAGNOSIS — G2581 Restless legs syndrome: Secondary | ICD-10-CM | POA: Diagnosis not present

## 2011-08-20 DIAGNOSIS — Z0000 Encounter for general adult medical examination without abnormal findings: Secondary | ICD-10-CM | POA: Diagnosis not present

## 2011-08-20 DIAGNOSIS — M899 Disorder of bone, unspecified: Secondary | ICD-10-CM | POA: Diagnosis not present

## 2011-08-20 DIAGNOSIS — N183 Chronic kidney disease, stage 3 unspecified: Secondary | ICD-10-CM | POA: Diagnosis not present

## 2011-09-01 DIAGNOSIS — M76899 Other specified enthesopathies of unspecified lower limb, excluding foot: Secondary | ICD-10-CM | POA: Diagnosis not present

## 2011-09-01 DIAGNOSIS — M25579 Pain in unspecified ankle and joints of unspecified foot: Secondary | ICD-10-CM | POA: Diagnosis not present

## 2011-10-21 DIAGNOSIS — L989 Disorder of the skin and subcutaneous tissue, unspecified: Secondary | ICD-10-CM | POA: Diagnosis not present

## 2011-11-13 DIAGNOSIS — L821 Other seborrheic keratosis: Secondary | ICD-10-CM | POA: Diagnosis not present

## 2011-11-13 DIAGNOSIS — L719 Rosacea, unspecified: Secondary | ICD-10-CM | POA: Diagnosis not present

## 2011-11-13 DIAGNOSIS — D235 Other benign neoplasm of skin of trunk: Secondary | ICD-10-CM | POA: Diagnosis not present

## 2011-12-30 ENCOUNTER — Other Ambulatory Visit (HOSPITAL_BASED_OUTPATIENT_CLINIC_OR_DEPARTMENT_OTHER): Payer: Medicare Other

## 2011-12-30 DIAGNOSIS — C50919 Malignant neoplasm of unspecified site of unspecified female breast: Secondary | ICD-10-CM | POA: Diagnosis not present

## 2011-12-30 DIAGNOSIS — E559 Vitamin D deficiency, unspecified: Secondary | ICD-10-CM

## 2011-12-30 LAB — COMPREHENSIVE METABOLIC PANEL
Alkaline Phosphatase: 69 U/L (ref 39–117)
CO2: 27 mEq/L (ref 19–32)
Creatinine, Ser: 0.85 mg/dL (ref 0.50–1.10)
Glucose, Bld: 130 mg/dL — ABNORMAL HIGH (ref 70–99)
Sodium: 134 mEq/L — ABNORMAL LOW (ref 135–145)
Total Bilirubin: 0.2 mg/dL — ABNORMAL LOW (ref 0.3–1.2)
Total Protein: 6.5 g/dL (ref 6.0–8.3)

## 2011-12-30 LAB — CBC WITH DIFFERENTIAL/PLATELET
BASO%: 0.7 % (ref 0.0–2.0)
Eosinophils Absolute: 0.1 10*3/uL (ref 0.0–0.5)
HCT: 38 % (ref 34.8–46.6)
LYMPH%: 24.5 % (ref 14.0–49.7)
MCHC: 34.2 g/dL (ref 31.5–36.0)
MCV: 90 fL (ref 79.5–101.0)
MONO%: 8.2 % (ref 0.0–14.0)
NEUT%: 65.7 % (ref 38.4–76.8)
Platelets: 297 10*3/uL (ref 145–400)
RBC: 4.22 10*6/uL (ref 3.70–5.45)

## 2011-12-31 LAB — VITAMIN D 25 HYDROXY (VIT D DEFICIENCY, FRACTURES): Vit D, 25-Hydroxy: 57 ng/mL (ref 30–89)

## 2012-01-01 ENCOUNTER — Telehealth: Payer: Self-pay | Admitting: *Deleted

## 2012-01-01 NOTE — Telephone Encounter (Signed)
Patient confirmed over the phone the new date and time 

## 2012-01-02 ENCOUNTER — Telehealth: Payer: Self-pay | Admitting: *Deleted

## 2012-01-02 NOTE — Telephone Encounter (Signed)
md out of the office on 02-05-2012 moved patient appointment to 02-09-2012 

## 2012-01-06 ENCOUNTER — Ambulatory Visit: Payer: Medicare Other | Admitting: Oncology

## 2012-01-23 DIAGNOSIS — R51 Headache: Secondary | ICD-10-CM | POA: Diagnosis not present

## 2012-01-29 ENCOUNTER — Other Ambulatory Visit: Payer: Self-pay | Admitting: Oncology

## 2012-01-29 DIAGNOSIS — Z9889 Other specified postprocedural states: Secondary | ICD-10-CM

## 2012-01-29 DIAGNOSIS — Z853 Personal history of malignant neoplasm of breast: Secondary | ICD-10-CM

## 2012-02-05 ENCOUNTER — Ambulatory Visit: Payer: Medicare Other | Admitting: Oncology

## 2012-02-05 ENCOUNTER — Other Ambulatory Visit: Payer: Medicare Other | Admitting: Lab

## 2012-02-09 ENCOUNTER — Telehealth: Payer: Self-pay | Admitting: Oncology

## 2012-02-09 ENCOUNTER — Ambulatory Visit (HOSPITAL_BASED_OUTPATIENT_CLINIC_OR_DEPARTMENT_OTHER): Payer: Medicare Other | Admitting: Oncology

## 2012-02-09 ENCOUNTER — Other Ambulatory Visit: Payer: Medicare Other | Admitting: Lab

## 2012-02-09 VITALS — BP 156/81 | HR 81 | Temp 98.9°F | Resp 20

## 2012-02-09 DIAGNOSIS — Z170 Estrogen receptor positive status [ER+]: Secondary | ICD-10-CM | POA: Diagnosis not present

## 2012-02-09 DIAGNOSIS — C50919 Malignant neoplasm of unspecified site of unspecified female breast: Secondary | ICD-10-CM | POA: Diagnosis not present

## 2012-02-09 NOTE — Telephone Encounter (Signed)
gve the pt her march 2014 appt calendar °

## 2012-02-09 NOTE — Progress Notes (Signed)
Hematology and Oncology Follow Up Visit  [name] [id number] 07-29-1935 76 y.o. 02/09/2012 3:50 PM   DIAGNOSIS: 76 year old with history of low-grade advanced ER PR positive breast cancer previously on neoadjuvant Femara therapy followed by surgery on 06/24/2002 with partial response Status post ration therapy to left breast completed 10/03/2010  Encounter Diagnosis  Name Primary?  . Malignant neoplasm of breast (female), unspecified site Yes     PAST THERAPY:    Interim History:  Patient returns for followup. She has been doing fairly well. She tolerates Femara well. Potassium and she's had some investigations were migratory aches and pains especially in her left leg in order. She is due to see the orthopedic surgeon. She denies any hot flashes or other evidence of myalgias or arthralgias. She is taking her calcium with vitamin D and extra vitamin D as well. In addition she is on Ambien. She is also on letrozole.  Medications: I have reviewed the patient's current medications.  Allergies:  Allergies  Allergen Reactions  . Amoxicillin     Past Medical History, Surgical history, Social history, and Family History were reviewed and updated.  Review of Systems: Constitutional:  Negative for fever, chills, night sweats, anorexia, weight loss, pain. Cardiovascular: negative Respiratory: negative Neurological: negative Dermatological: negative ENT: negative Skin Gastrointestinal: negative Genito-Urinary: negative Hematological and Lymphatic: negative Breast: negative Musculoskeletal: negative Remaining ROS negative.  Physical Exam:  Blood pressure 156/81, pulse 81, temperature 98.9 F (37.2 C), temperature source Oral, resp. rate 20.  ECOG:  0  General appearance: alert, cooperative and appears stated age Eyes: conjunctivae/corneas clear. PERRL, EOM's intact. Fundi benign. Throat: lips, mucosa, and tongue normal; teeth and gums normal Resp: clear to auscultation  bilaterally and normal percussion bilaterally Breasts: normal appearance, no masses or tenderness, Inspection negative, right breast has fibrocystic changes. Cardio: regular rate and rhythm, S1, S2 normal, no murmur, click, rub or gallop and normal apical impulse GI: soft, non-tender; bowel sounds normal; no masses,  no organomegaly Extremities: extremities normal, atraumatic, no cyanosis or edema Lymph nodes: Cervical, supraclavicular, and axillary nodes normal. Neurologic: Alert and oriented X 3, normal strength and tone. Normal symmetric reflexes. Normal coordination and gait   Lab Results: Lab Results  Component Value Date   WBC 6.4 12/30/2011   HGB 13.0 12/30/2011   HCT 38.0 12/30/2011   MCV 90.0 12/30/2011   PLT 297 12/30/2011     Chemistry      Component Value Date/Time   NA 134* 12/30/2011 1448   K 3.3* 12/30/2011 1448   CL 97 12/30/2011 1448   CO2 27 12/30/2011 1448   BUN 14 12/30/2011 1448   CREATININE 0.85 12/30/2011 1448      Component Value Date/Time   CALCIUM 9.7 12/30/2011 1448   ALKPHOS 69 12/30/2011 1448   AST 22 12/30/2011 1448   ALT 27 12/30/2011 1448   BILITOT 0.2* 12/30/2011 1448       Radiological Studies:  No results found.   IMPRESSIONS AND PLAN: A 76 y.o. female with   History of locally advanced ER PR positive breast cancer currently on Femara. I will see her in 6 months time for followup as well as imaging studies. Lab work today looks normal.she is due for a followup mammogram in the next month or so which she will schedule herself. Spent more than half the time coordinating care.    Bayley Yarborough 9/23/20133:50 PM

## 2012-02-10 ENCOUNTER — Other Ambulatory Visit: Payer: Self-pay | Admitting: Oncology

## 2012-02-10 DIAGNOSIS — C50919 Malignant neoplasm of unspecified site of unspecified female breast: Secondary | ICD-10-CM

## 2012-02-18 ENCOUNTER — Ambulatory Visit: Payer: Medicare Other | Admitting: Oncology

## 2012-02-24 DIAGNOSIS — R51 Headache: Secondary | ICD-10-CM | POA: Diagnosis not present

## 2012-02-24 DIAGNOSIS — I10 Essential (primary) hypertension: Secondary | ICD-10-CM | POA: Diagnosis not present

## 2012-02-24 DIAGNOSIS — M899 Disorder of bone, unspecified: Secondary | ICD-10-CM | POA: Diagnosis not present

## 2012-02-24 DIAGNOSIS — Z23 Encounter for immunization: Secondary | ICD-10-CM | POA: Diagnosis not present

## 2012-02-24 DIAGNOSIS — G2581 Restless legs syndrome: Secondary | ICD-10-CM | POA: Diagnosis not present

## 2012-02-26 ENCOUNTER — Ambulatory Visit
Admission: RE | Admit: 2012-02-26 | Discharge: 2012-02-26 | Disposition: A | Discharge status: Home / Self Care | Payer: Medicare Other | Source: Ambulatory Visit | Attending: Oncology | Admitting: Oncology

## 2012-02-26 DIAGNOSIS — Z9889 Other specified postprocedural states: Secondary | ICD-10-CM

## 2012-02-26 DIAGNOSIS — R928 Other abnormal and inconclusive findings on diagnostic imaging of breast: Secondary | ICD-10-CM | POA: Diagnosis not present

## 2012-02-26 DIAGNOSIS — Z853 Personal history of malignant neoplasm of breast: Secondary | ICD-10-CM

## 2012-04-26 DIAGNOSIS — G5600 Carpal tunnel syndrome, unspecified upper limb: Secondary | ICD-10-CM | POA: Diagnosis not present

## 2012-06-08 ENCOUNTER — Other Ambulatory Visit: Payer: Self-pay | Admitting: Oncology

## 2012-06-26 ENCOUNTER — Encounter: Payer: Self-pay | Admitting: Oncology

## 2012-06-26 ENCOUNTER — Telehealth: Payer: Self-pay | Admitting: *Deleted

## 2012-06-26 NOTE — Telephone Encounter (Signed)
Pt request Dr. Darnelle Catalan.  Confirmed new appt date and time with Warner Mccreedy , NP on 07/20/12 at 2:15

## 2012-07-05 ENCOUNTER — Telehealth: Payer: Self-pay | Admitting: *Deleted

## 2012-07-05 NOTE — Telephone Encounter (Signed)
Pt called wanting to verify that she would see the doctor on this visit w/ the APP and I told her yes and she is fine.

## 2012-07-19 ENCOUNTER — Telehealth: Payer: Self-pay | Admitting: *Deleted

## 2012-07-19 NOTE — Telephone Encounter (Signed)
Called to r/s f/u appt to 07/27/12 at 2:45 with Bobbe Medico, NP

## 2012-07-20 ENCOUNTER — Ambulatory Visit: Payer: Medicare Other | Admitting: Gynecologic Oncology

## 2012-07-20 ENCOUNTER — Other Ambulatory Visit: Payer: Self-pay | Admitting: Internal Medicine

## 2012-07-20 DIAGNOSIS — R922 Inconclusive mammogram: Secondary | ICD-10-CM

## 2012-07-27 ENCOUNTER — Ambulatory Visit: Payer: Medicare Other | Admitting: Nurse Practitioner

## 2012-07-27 NOTE — Progress Notes (Unsigned)
[facility] OFFICE PROGRESS NOTE  Kaitlyn Bos, MD  DIAGNOSIS:   PAST THERAPY:   CURRENT THERAPY:  INTERVAL HISTORY: [name] 77 y.o. female returns for ***  MEDICAL HISTORY:No past medical history on file.  SURGICAL HISTORY: No past surgical history on file.  MEDICATIONS: Current Outpatient Prescriptions  Medication Sig Dispense Refill  . calcium carbonate (OS-CAL) 600 MG TABS Take 600 mg by mouth 2 (two) times daily with a meal.      . cholecalciferol (VITAMIN D) 1000 UNITS tablet Take 1,000 Units by mouth 2 (two) times daily.      . ferrous fumarate (HEMOCYTE - 106 MG FE) 325 (106 FE) MG TABS Take 1 tablet by mouth.      . letrozole (FEMARA) 2.5 MG tablet TAKE 1 TABLET DAILY  90 tablet  3  . Multiple Vitamin (MULTIVITAMIN) tablet Take 1 tablet by mouth daily.      . Triamterene-HCTZ (MAXZIDE PO) Take 25 mg by mouth daily.      Marland Kitchen zolpidem (AMBIEN) 5 MG tablet TAKE ONE TABLET BY MOUTH AT BEDTIME AS NEEDED  30 tablet  1   No current facility-administered medications for this visit.    ALLERGIES:  is allergic to amoxicillin.  REVIEW OF SYSTEMS: General: denies unexplained weight loss, fatigue, night sweats, fevers, chills HEENT: denies headaches, blurred vision, dizziness, loss of balance Cardiac: denies chest pain, pressure or palpitations Lungs: denies wheezing, shortness of breath or productive cough Abd: denies nausea, vomiting, constipation, diarrhea, indigestion, blood in stool or urine, dysphagia Extremities: denies numbness, tingling, swelling or pain  The rest of the 14-point review of system was negative.   There were no vitals filed for this visit. Wt Readings from Last 3 Encounters:  07/04/11 182 lb (82.555 kg)   ECOG Performance status:   PHYSICAL EXAMINATION:  77 yr old female who appears  General:  well-nourished in no acute distress.   Eyes:  no scleral icterus.   ENT:  There were no oropharyngeal lesions.  Neck was  without thyromegaly.   Lymphatics:  Negative cervical, supraclavicular, axillary, or inguinal adenopathy.  Respiratory: lungs were clear bilaterally without wheezing or crackles.  Cardiovascular:  Regular rate and rhythm, S1/S2, without murmur, rub or gallop.  There was no pedal edema.   Breast: no nipple retraction, no nipple discharge, no unusual masses or thickening, negative for palpable abnormalities, surgical scar noted  GI:  abdomen was soft, flat, nontender, nondistended, without organomegaly.   Musculoskeletal:  no spinal tenderness of palpation of vertebral spine.   Skin exam was without echymosis, petichae.   Neuro exam was nonfocal.  Cranial nerves II- XII grossly intact Patient was able to get on and off exam table without assistance.  Gait was normal.  Patient was alerted and oriented.  Attention was good.   Language was appropriate.  Mood was normal without depression.  Speech was not pressured.  Thought content was not tangential.         LABORATORY/RADIOLOGY DATA:  Lab Results  Component Value Date   WBC 6.4 12/30/2011   HGB 13.0 12/30/2011   HCT 38.0 12/30/2011   PLT 297 12/30/2011   GLUCOSE 130* 12/30/2011   ALT 27 12/30/2011   AST 22 12/30/2011   NA 134* 12/30/2011   K 3.3* 12/30/2011   CL 97 12/30/2011   CREATININE 0.85 12/30/2011   BUN 14 12/30/2011   CO2 27 12/30/2011    No results found.     ASSESSMENT AND PLAN:  Bobbe Medico, AOCNP, NP-C

## 2012-07-29 ENCOUNTER — Telehealth: Payer: Self-pay | Admitting: *Deleted

## 2012-07-29 NOTE — Telephone Encounter (Signed)
Called pt to inform her that the day that she is rescheduled to see Amy is a day that Dr. Darnelle Catalan is not here and we would like to reschedule her for when he is.  She agreed and I confirmed 08/09/12 appt w/ pt.

## 2012-07-29 NOTE — Telephone Encounter (Signed)
sw pt appts d/t was given for 08/06/12@ 1:15pm.

## 2012-08-06 ENCOUNTER — Ambulatory Visit: Payer: Medicare Other | Admitting: Physician Assistant

## 2012-08-09 ENCOUNTER — Telehealth: Payer: Self-pay | Admitting: Oncology

## 2012-08-09 ENCOUNTER — Ambulatory Visit (HOSPITAL_BASED_OUTPATIENT_CLINIC_OR_DEPARTMENT_OTHER): Payer: Medicare Other | Admitting: Lab

## 2012-08-09 ENCOUNTER — Encounter: Payer: Self-pay | Admitting: Family

## 2012-08-09 ENCOUNTER — Ambulatory Visit (HOSPITAL_BASED_OUTPATIENT_CLINIC_OR_DEPARTMENT_OTHER): Payer: Medicare Other | Admitting: Family

## 2012-08-09 VITALS — BP 123/81 | HR 91 | Temp 98.1°F | Resp 20 | Ht 65.0 in | Wt 183.5 lb

## 2012-08-09 DIAGNOSIS — C50912 Malignant neoplasm of unspecified site of left female breast: Secondary | ICD-10-CM

## 2012-08-09 DIAGNOSIS — M8580 Other specified disorders of bone density and structure, unspecified site: Secondary | ICD-10-CM

## 2012-08-09 DIAGNOSIS — M899 Disorder of bone, unspecified: Secondary | ICD-10-CM | POA: Diagnosis not present

## 2012-08-09 DIAGNOSIS — C50919 Malignant neoplasm of unspecified site of unspecified female breast: Secondary | ICD-10-CM

## 2012-08-09 DIAGNOSIS — Z170 Estrogen receptor positive status [ER+]: Secondary | ICD-10-CM | POA: Diagnosis not present

## 2012-08-09 DIAGNOSIS — G43909 Migraine, unspecified, not intractable, without status migrainosus: Secondary | ICD-10-CM

## 2012-08-09 DIAGNOSIS — E559 Vitamin D deficiency, unspecified: Secondary | ICD-10-CM | POA: Diagnosis not present

## 2012-08-09 DIAGNOSIS — M949 Disorder of cartilage, unspecified: Secondary | ICD-10-CM

## 2012-08-09 HISTORY — DX: Malignant neoplasm of unspecified site of left female breast: C50.912

## 2012-08-09 LAB — COMPREHENSIVE METABOLIC PANEL (CC13)
ALT: 23 U/L (ref 0–55)
BUN: 24.1 mg/dL (ref 7.0–26.0)
CO2: 30 mEq/L — ABNORMAL HIGH (ref 22–29)
Creatinine: 1.2 mg/dL — ABNORMAL HIGH (ref 0.6–1.1)
Total Bilirubin: 0.28 mg/dL (ref 0.20–1.20)

## 2012-08-09 LAB — CBC WITH DIFFERENTIAL/PLATELET
Eosinophils Absolute: 0.1 10*3/uL (ref 0.0–0.5)
LYMPH%: 28.6 % (ref 14.0–49.7)
MCHC: 34 g/dL (ref 31.5–36.0)
MCV: 88.7 fL (ref 79.5–101.0)
MONO%: 8 % (ref 0.0–14.0)
NEUT#: 4.2 10*3/uL (ref 1.5–6.5)
NEUT%: 60.9 % (ref 38.4–76.8)
Platelets: 308 10*3/uL (ref 145–400)
RBC: 4.4 10*6/uL (ref 3.70–5.45)

## 2012-08-09 MED ORDER — SUMATRIPTAN SUCCINATE 50 MG PO TABS: 50.0000 mg | ORAL_TABLET | ORAL | Status: DC | PRN

## 2012-08-09 NOTE — Telephone Encounter (Signed)
gv pt appt schedule for October and appts for bone density and mammo 08/25/12. Per pt mammo moved to 4/9 to coord w/bone density.

## 2012-08-09 NOTE — Progress Notes (Signed)
[facility]  Telephone:([phone]) [phone] Fax:([phone]) [phone]  OFFICE PROGRESS NOTE   ID: [name]   DOB: [birth year]  MR#: [id number]  GNF#:[id number]   PCP: [name], MD SU: [name], MD RAD ONC: [name], MD OTHER MD:  [name], MD   HISTORY OF PRESENT ILLNESS: From Dr. Theron Arista Rubin's New Patient Evaluation Dated 01/02/2010: "This is a delightful 77 year old woman here today with her husband and her daughter, [name], for evaluation and treatment of breast cancer.  This woman has been in reasonably good health.  She has undergone annual screening mammography.  A mammogram performed on 11/29/2009 showed a focal asymmetry at 3 o'clock position in the left breast.  Additional views are recommended.  On 12/04/2009, additional views with ultrasound of the breast were performed, which showed ultimately to be an area of heterogeneous echogenicity at 3 o'clock position.  No discrete mass was seen.  Biopsy was recommended.  A biopsy performed on 12/19/2009 showed invasive ductal cancer of intermediate grade.  No evidence of angiolymphatic invasion.  The tumor was ER/PR positive 100% respectively, proliferative index 63%.  No amplification by HER-2 by [facility].  The patient has had a preoperative MRI scan, which essentially showed this mass to be somewhat larger over the area of 12.9 x 2.5 x 2.2 cm.  No enlarged lymph nodes were seen."  Her subsequent history is as detailed below.  INTERVAL HISTORY: Dr. Darnelle Catalan and I saw [name] today for follow up of invasive ductal carcinoma of the left breast, diagnosed in 11/2009. The patient is accompanied by her husband, [name] for today's office visit. The patient was last seen by Dr. Donnie Coffin on 02/09/2012. The patient is establishing herself with Dr. Darrall Dears service today.  REVIEW OF SYSTEMS: A 10 point review of systems was completed and is negative except for occasional back spasms for the past 2 years (the  patient states that she started taking Femara).  The patient has complaints of bilateral carpal tunnel syndrome and right hand trigger finger.  She states that her right hand experiences greater discomfort than the left hand.  The patient received a cortisone injection in 04/2012 by Dr. Teressa Senter. The patient has also had migraine headaches x 2 episodes - one episode was in 02/2012 which lasted for 8 days and the other episode was earlier this month which also lasted for 8 days. The patient states her migraine headaches are accompanied with phonophobia, photophobia and nausea without emesis. The patient was given Tramadol by her primary care physician for the headaches, but she states Tramadol is not working for her. The patient denies any other symptomatology.   PAST MEDICAL HISTORY: Past Medical History  Diagnosis Date  . Breast cancer   . Migraine   . Hypertension   . Menorrhagia   . Stress incontinence   . Gall stones     PAST SURGICAL HISTORY: Past Surgical History  Procedure Laterality Date  . Abdominal hysterectomy      Ovaries retained  . Breast lumpectomy Left 01/02/10  . Bladder surgery      Bladder Tack  . Cholecystectomy    . Shoulder surgery Bilateral   . Breast mass excision      Benign lump removal    FAMILY HISTORY Family History  Problem Relation Age of Onset  . Heart Problems Mother   . Hypertension Mother   . Diabetes Father   . Cancer Sister     Lung and Ovarian  .  Diabetes Sister   . Heart Problems Brother   . Diabetes Brother   . Diabetes Sister   . Diabetes Sister      GYNECOLOGIC HISTORY: G2, P2.  Menarche age 36.  Hysterectomy at age 24.  She used hormone therapy for a total of 20 years, she stopped hormone therapy in 2005.   SOCIAL HISTORY: Married for 56 years, have two adult children.  She did office work until retirement.  Her husband worked for [employer].  They have five grandchildren.   ADVANCED DIRECTIVES: Not on file  HEALTH  MAINTENANCE: History  Substance Use Topics  . Smoking status: Never Smoker   . Smokeless tobacco: Never Used  . Alcohol Use: No    Colonoscopy: Not on file PAP: Not on file Bone density: She states that she gets bone density examinations by her primary care physician Dr. Earl Gala. Lipid panel: Not on file  Allergies  Allergen Reactions  . Amoxicillin     Current Outpatient Prescriptions  Medication Sig Dispense Refill  . calcium carbonate (OS-CAL) 600 MG TABS Take 600 mg by mouth 2 (two) times daily with a meal.      . cholecalciferol (VITAMIN D) 1000 UNITS tablet Take 1,000 Units by mouth 2 (two) times daily.      Marland Kitchen KLOR-CON M20 20 MEQ tablet       . letrozole (FEMARA) 2.5 MG tablet TAKE 1 TABLET DAILY  90 tablet  3  . metroNIDAZOLE (METROCREAM) 0.75 % cream       . Triamterene-HCTZ (MAXZIDE PO) Take 25 mg by mouth daily.      . SUMAtriptan (IMITREX) 50 MG tablet Take 1 tablet (50 mg total) by mouth every 2 (two) hours as needed for migraine.  10 tablet  0   No current facility-administered medications for this visit.    OBJECTIVE: Filed Vitals:   08/09/12 1402  BP: 123/81  Pulse: 91  Temp: 98.1 F (36.7 C)  Resp: 20     Body mass index is 30.54 kg/(m^2).      ECOG FS:  Grade 1 - Symptomatic, but fully ambulatory   General appearance: Alert, cooperative, well nourished, no apparent distress Head: Normocephalic, without obvious abnormality, atraumatic Eyes: Arcus senilis, PERRLA, EOMI Nose: Nares, septum and mucosa are normal, no drainage or sinus tenderness Neck: No adenopathy, supple, symmetrical, trachea midline, thyroid not enlarged, no tenderness Resp: Clear to auscultation bilaterally Cardio: Regular rate and rhythm, S1, S2 normal, no murmur, click, rub or gallop Breasts: Soft and pendulous bilaterally, well-healed surgical scar, no lymphadenopathy, no nipple inversion, no axilla fullness GI: Soft, distended, non-tender, hypoactive bowel sounds, no  organomegaly Extremities: Extremities normal, atraumatic, no cyanosis or edema Lymph nodes: Cervical, supraclavicular, and axillary nodes normal Neurologic: Grossly normal   LAB RESULTS: Lab Results  Component Value Date   WBC 6.9 08/09/2012   NEUTROABS 4.2 08/09/2012   HGB 13.3 08/09/2012   HCT 39.0 08/09/2012   MCV 88.7 08/09/2012   PLT 308 08/09/2012      Chemistry      Component Value Date/Time   NA 138 08/09/2012 1546   NA 134* 12/30/2011 1448   K 3.7 08/09/2012 1546   K 3.3* 12/30/2011 1448   CL 99 08/09/2012 1546   CL 97 12/30/2011 1448   CO2 30* 08/09/2012 1546   CO2 27 12/30/2011 1448   BUN 24.1 08/09/2012 1546   BUN 14 12/30/2011 1448   CREATININE 1.2* 08/09/2012 1546   CREATININE 0.85 12/30/2011 1448  Component Value Date/Time   CALCIUM 9.9 08/09/2012 1546   CALCIUM 9.7 12/30/2011 1448   ALKPHOS 89 08/09/2012 1546   ALKPHOS 69 12/30/2011 1448   AST 19 08/09/2012 1546   AST 22 12/30/2011 1448   ALT 23 08/09/2012 1546   ALT 27 12/30/2011 1448   BILITOT 0.28 08/09/2012 1546   BILITOT 0.2* 12/30/2011 1448       Lab Results  Component Value Date   LABCA2 22 01/02/2010    Urinalysis    Component Value Date/Time   COLORURINE YELLOW 05/28/2008 1310   APPEARANCEUR CLOUDY* 05/28/2008 1310   LABSPEC 1.010 05/28/2008 1310   PHURINE 6.0 05/28/2008 1310   GLUCOSEU NEGATIVE 05/28/2008 1310   HGBUR TRACE* 05/28/2008 1310   BILIRUBINUR NEGATIVE 05/28/2008 1310   KETONESUR NEGATIVE 05/28/2008 1310   PROTEINUR NEGATIVE 05/28/2008 1310   UROBILINOGEN 0.2 05/28/2008 1310   NITRITE NEGATIVE 05/28/2008 1310   LEUKOCYTESUR LARGE* 05/28/2008 1310    STUDIES: No results found.  ASSESSMENT: 77 y.o. [employer], [place] woman:  1. Status post abnormal mammogram which showed a mass in her left breast on 08/30/2009.  She then underwent a breast ultrasound on 12/04/2009. The patient had a left breast biopsy on 12/19/2009 which showed invasive ductal carcinoma, ER  positive, PR positive,  Ki-67 63%, HER-2/neu negative. The patient had MRI on 12/26/2009 which showed a spiculated 4.9 cm mass consistent with known malignancy in the lateral portion of the left breast.    2. The patient started neoadjuvant antiestrogen therapy with Femara on 01/02/2010.   3. On 06/24/2010 the patient had a left breast lumpectomy with sentinel node biopsy for a stage I, T1c N0, invasive ductal carcinoma, multifocal involving the superior margin with high-grade DCIS with calcifications and necrosis multifocal, grade 2, ER 100%, PR 100%, Ki-67 63%, HER-2/neu negative, and 0/1 positive lymph nodes. The patient had a reexcision of the superior margin on 07/11/2010 in which benign fibrocystic changes were found, no malignancy identified, and the final margins were clear.  4. Status post radiation therapy from 08/21/2010-10/03/2010.  5. Continuation of anti-estrogen therapy with Femara since 12/2009.  6. Abnormal mammogram on 02/26/2012 which showed a solitary linear calcification in a left lumpectomy site, likely representing a developing fat necrosis. Diagnostic left mammogram in 6 months with magnification views.  7. Migraine headaches  8. Osteopenia  9. Bilateral carpal tunnel syndrome.   PLAN: 1. The patient will continue antiestrogen therapy with Femara for a total of 5 years (until 12/2014). We plan to see the patient again in 02/2013 at which time we will check a CBC, CMP and vitamin D level. Vitamin D level drawn today is pending at this time. The patient is to have a bilateral digital diagnostic mammogram prior to her next office visit.  This mammogram will put her back on tract for having her annual mammograms in October. The patient states that she does not need a prescription for Femara at this time, but will contact us when she needs this medication refilled.  2. The patient is having a left diagnostic unilateral mammogram in 08/2012 for follow up of an abnormal mammogram she had on  02/26/2012.  3. An electronic prescription for Imitrex 50 mg by mouth every 2 hours when necessary for migraine headache was sent to the patient's pharmacy.  4. The patient was encouraged to continue taking calcium carbonate 1200 mg by mouth daily and vitamin D 1000 IUs by mouth daily for osteopenia. The patient will be scheduled for  a bone density scan prior to her next office visit.  5. The patient is followed by Dr. Josephine Igo for bilateral carpal tunnel syndrome and right hand trigger finger.  All questions were answered.  The patient was encouraged to contact us in the interim with any problems, questions or concerns.   Larina Bras, NP-C 08/09/2012, 6:14 PM

## 2012-08-09 NOTE — Patient Instructions (Addendum)
Please contact us at [phone] if you have any questions or concerns.  Exercise daily, eat a healthy diet, drink plenty of water

## 2012-08-10 ENCOUNTER — Telehealth: Payer: Self-pay

## 2012-08-10 LAB — VITAMIN D 25 HYDROXY (VIT D DEFICIENCY, FRACTURES): Vit D, 25-Hydroxy: 59 ng/mL (ref 30–89)

## 2012-08-10 NOTE — Telephone Encounter (Signed)
Spoke with pt regarding lab results for 3/24. Informed pt that labs were ok per [facility] and to continue taking the Vit D, Calcium, and K. Pt voiced understanding and had no questions. TMB

## 2012-08-12 ENCOUNTER — Ambulatory Visit: Payer: Medicare Other | Admitting: Oncology

## 2012-08-17 DIAGNOSIS — I10 Essential (primary) hypertension: Secondary | ICD-10-CM | POA: Diagnosis not present

## 2012-08-17 DIAGNOSIS — R51 Headache: Secondary | ICD-10-CM | POA: Diagnosis not present

## 2012-08-17 DIAGNOSIS — N183 Chronic kidney disease, stage 3 unspecified: Secondary | ICD-10-CM | POA: Diagnosis not present

## 2012-08-24 ENCOUNTER — Ambulatory Visit
Admission: RE | Admit: 2012-08-24 | Discharge: 2012-08-24 | Disposition: A | Discharge status: Home / Self Care | Payer: Medicare Other | Source: Ambulatory Visit | Attending: Internal Medicine | Admitting: Internal Medicine

## 2012-08-24 DIAGNOSIS — R922 Inconclusive mammogram: Secondary | ICD-10-CM

## 2012-08-24 DIAGNOSIS — R928 Other abnormal and inconclusive findings on diagnostic imaging of breast: Secondary | ICD-10-CM | POA: Diagnosis not present

## 2012-08-24 DIAGNOSIS — R9230 Dense breasts, unspecified: Secondary | ICD-10-CM

## 2012-08-25 ENCOUNTER — Other Ambulatory Visit: Payer: Medicare Other

## 2012-10-13 DIAGNOSIS — H2510 Age-related nuclear cataract, unspecified eye: Secondary | ICD-10-CM | POA: Diagnosis not present

## 2012-10-13 DIAGNOSIS — H43399 Other vitreous opacities, unspecified eye: Secondary | ICD-10-CM | POA: Diagnosis not present

## 2012-10-13 DIAGNOSIS — H524 Presbyopia: Secondary | ICD-10-CM | POA: Diagnosis not present

## 2012-11-08 DIAGNOSIS — R0602 Shortness of breath: Secondary | ICD-10-CM | POA: Diagnosis not present

## 2012-11-08 DIAGNOSIS — I10 Essential (primary) hypertension: Secondary | ICD-10-CM | POA: Diagnosis not present

## 2012-11-08 DIAGNOSIS — N183 Chronic kidney disease, stage 3 unspecified: Secondary | ICD-10-CM | POA: Diagnosis not present

## 2012-11-22 DIAGNOSIS — R0602 Shortness of breath: Secondary | ICD-10-CM | POA: Diagnosis not present

## 2012-12-06 DIAGNOSIS — G5600 Carpal tunnel syndrome, unspecified upper limb: Secondary | ICD-10-CM | POA: Diagnosis not present

## 2012-12-23 DIAGNOSIS — D235 Other benign neoplasm of skin of trunk: Secondary | ICD-10-CM | POA: Diagnosis not present

## 2012-12-23 DIAGNOSIS — L719 Rosacea, unspecified: Secondary | ICD-10-CM | POA: Diagnosis not present

## 2012-12-23 DIAGNOSIS — IMO0002 Reserved for concepts with insufficient information to code with codable children: Secondary | ICD-10-CM | POA: Diagnosis not present

## 2012-12-28 ENCOUNTER — Other Ambulatory Visit: Payer: Self-pay | Admitting: Orthopedic Surgery

## 2013-01-04 ENCOUNTER — Encounter (HOSPITAL_BASED_OUTPATIENT_CLINIC_OR_DEPARTMENT_OTHER): Payer: Self-pay | Admitting: *Deleted

## 2013-01-04 ENCOUNTER — Other Ambulatory Visit: Payer: Self-pay | Admitting: Orthopedic Surgery

## 2013-01-04 NOTE — Progress Notes (Signed)
Bring all medications. Plans to come for BMET and EKG on Friday.

## 2013-01-07 ENCOUNTER — Encounter (HOSPITAL_BASED_OUTPATIENT_CLINIC_OR_DEPARTMENT_OTHER)
Admission: RE | Admit: 2013-01-07 | Discharge: 2013-01-07 | Disposition: A | Discharge status: Home / Self Care | Payer: Medicare Other | Source: Ambulatory Visit | Attending: Orthopedic Surgery | Admitting: Orthopedic Surgery

## 2013-01-07 DIAGNOSIS — Z01812 Encounter for preprocedural laboratory examination: Secondary | ICD-10-CM | POA: Insufficient documentation

## 2013-01-07 DIAGNOSIS — Z01810 Encounter for preprocedural cardiovascular examination: Secondary | ICD-10-CM | POA: Diagnosis not present

## 2013-01-07 DIAGNOSIS — Z01818 Encounter for other preprocedural examination: Secondary | ICD-10-CM | POA: Insufficient documentation

## 2013-01-07 LAB — BASIC METABOLIC PANEL
BUN: 20 mg/dL (ref 6–23)
CO2: 27 mEq/L (ref 19–32)
Chloride: 94 mEq/L — ABNORMAL LOW (ref 96–112)
Creatinine, Ser: 0.93 mg/dL (ref 0.50–1.10)

## 2013-01-10 NOTE — H&P (Signed)
  [name] is an 77 y.o. female.   Chief Complaint: c/o chronic and progressive numbness and tingling of the right hand HPI: [name] returns requesting surgery for carpal tunnel on the right. We have followed [name] for many years.  In 2013 we identified significant stenosing tenosynovitis and electrodiagnostic studies confirmed bilateral carpal tunnel syndrome right worse than left.    Past Medical History  Diagnosis Date  . Breast cancer   . Migraine   . Hypertension   . Menorrhagia   . Stress incontinence   . Gall stones   . Shortness of breath     once a year- gets checked by Dr.    Past Surgical History  Procedure Laterality Date  . Abdominal hysterectomy      Ovaries retained  . Breast lumpectomy Left 01/02/10  . Bladder surgery      Bladder Tack  . Cholecystectomy    . Shoulder surgery Bilateral   . Breast mass excision      Benign lump removal    Family History  Problem Relation Age of Onset  . Heart Problems Mother   . Hypertension Mother   . Diabetes Father   . Cancer Sister     Lung and Ovarian  . Diabetes Sister   . Heart Problems Brother   . Diabetes Brother   . Diabetes Sister   . Diabetes Sister    Social History:  reports that she has never smoked. She has never used smokeless tobacco. She reports that she does not drink alcohol or use illicit drugs.  Allergies:  Allergies  Allergen Reactions  . Amoxicillin     No prescriptions prior to admission    No results found for this or any previous visit (from the past 48 hour(s)).  No results found.   Pertinent items are noted in HPI.  Height 5\' 4"  (1.626 m), weight 82.555 kg (182 lb).  General appearance: alert Head: Normocephalic, without obvious abnormality Neck: supple, symmetrical, trachea midline Resp: clear to auscultation bilaterally Cardio: regular rate and rhythm GI: normal findings: bowel sounds normal Extremities: She has positive provocative signs of carpal tunnel  syndrome.  She has full range of motion of her fingers in flexion/extension, no sign of active stenosing tenosynovitis in her thumb, fingers or first dorsal compartment at this time.  Pulse and cap refill are intact.    We reviewed her past medical history, her electrodiagnostic studies reveal prolongation of the motor and sensory latencies as well as a very diminished sensory amplitude on the right.     Pulses: 2+ and symmetric Skin: normal Neurologic: Grossly normal    Assessment/Plan Impression: Right CTS  Plan: To the OR for Right CTR.The procedure, risks,benefits and post-op course were discussed with the patient at length and they were in agreement with the plan.  Good,Kaitlyn Trudo J 01/10/2013, 1:53 PM   H&P documentation: 01/11/2013  -History and Physical Reviewed  -Patient has been re-examined  -No change in the plan of care  Wyn Forster, MD

## 2013-01-11 ENCOUNTER — Ambulatory Visit (HOSPITAL_BASED_OUTPATIENT_CLINIC_OR_DEPARTMENT_OTHER)
Admission: RE | Admit: 2013-01-11 | Discharge: 2013-01-11 | Disposition: A | Discharge status: Home / Self Care | Payer: Medicare Other | Source: Ambulatory Visit | Attending: Orthopedic Surgery | Admitting: Orthopedic Surgery

## 2013-01-11 ENCOUNTER — Encounter (HOSPITAL_BASED_OUTPATIENT_CLINIC_OR_DEPARTMENT_OTHER)
Admission: RE | Disposition: A | Discharge status: Home / Self Care | Payer: Self-pay | Source: Ambulatory Visit | Attending: Orthopedic Surgery

## 2013-01-11 ENCOUNTER — Ambulatory Visit (HOSPITAL_BASED_OUTPATIENT_CLINIC_OR_DEPARTMENT_OTHER): Payer: Medicare Other | Admitting: Anesthesiology

## 2013-01-11 ENCOUNTER — Encounter (HOSPITAL_BASED_OUTPATIENT_CLINIC_OR_DEPARTMENT_OTHER): Payer: Self-pay | Admitting: Anesthesiology

## 2013-01-11 ENCOUNTER — Encounter (HOSPITAL_BASED_OUTPATIENT_CLINIC_OR_DEPARTMENT_OTHER): Payer: Self-pay

## 2013-01-11 DIAGNOSIS — M65839 Other synovitis and tenosynovitis, unspecified forearm: Secondary | ICD-10-CM | POA: Insufficient documentation

## 2013-01-11 DIAGNOSIS — I10 Essential (primary) hypertension: Secondary | ICD-10-CM | POA: Insufficient documentation

## 2013-01-11 DIAGNOSIS — G5600 Carpal tunnel syndrome, unspecified upper limb: Secondary | ICD-10-CM | POA: Insufficient documentation

## 2013-01-11 DIAGNOSIS — Z853 Personal history of malignant neoplasm of breast: Secondary | ICD-10-CM | POA: Insufficient documentation

## 2013-01-11 DIAGNOSIS — M6530 Trigger finger, unspecified finger: Secondary | ICD-10-CM | POA: Diagnosis not present

## 2013-01-11 HISTORY — DX: Shortness of breath: R06.02

## 2013-01-11 HISTORY — PX: CARPAL TUNNEL RELEASE: SHX101

## 2013-01-11 LAB — POCT HEMOGLOBIN-HEMACUE: Hemoglobin: 13.9 g/dL (ref 12.0–15.0)

## 2013-01-11 SURGERY — CARPAL TUNNEL RELEASE
Anesthesia: General | Site: Wrist | Laterality: Right | Wound class: Clean

## 2013-01-11 MED ORDER — CHLORHEXIDINE GLUCONATE 4 % EX LIQD: 60.0000 mL | Freq: Once | CUTANEOUS | Status: DC

## 2013-01-11 MED ORDER — ONDANSETRON HCL 4 MG/2ML IJ SOLN
INTRAMUSCULAR | Status: DC | PRN
  Administered 2013-01-11: 4 mg via INTRAVENOUS

## 2013-01-11 MED ORDER — MIDAZOLAM HCL 5 MG/5ML IJ SOLN
INTRAMUSCULAR | Status: DC | PRN
  Administered 2013-01-11: 1 mg via INTRAVENOUS

## 2013-01-11 MED ORDER — LIDOCAINE HCL (CARDIAC) 20 MG/ML IV SOLN
INTRAVENOUS | Status: DC | PRN
  Administered 2013-01-11: 40 mg via INTRAVENOUS

## 2013-01-11 MED ORDER — FENTANYL CITRATE 0.05 MG/ML IJ SOLN
25.0000 ug | INTRAMUSCULAR | Status: DC | PRN
  Administered 2013-01-11: 25 ug via INTRAVENOUS

## 2013-01-11 MED ORDER — FENTANYL CITRATE 0.05 MG/ML IJ SOLN
INTRAMUSCULAR | Status: DC | PRN
  Administered 2013-01-11: 25 ug via INTRAVENOUS
  Administered 2013-01-11: 50 ug via INTRAVENOUS
  Administered 2013-01-11: 25 ug via INTRAVENOUS

## 2013-01-11 MED ORDER — LACTATED RINGERS IV SOLN
INTRAVENOUS | Status: DC
  Administered 2013-01-11 (×2): via INTRAVENOUS

## 2013-01-11 MED ORDER — DEXAMETHASONE SODIUM PHOSPHATE 10 MG/ML IJ SOLN
INTRAMUSCULAR | Status: DC | PRN
  Administered 2013-01-11: 10 mg via INTRAVENOUS

## 2013-01-11 MED ORDER — PROPOFOL 10 MG/ML IV BOLUS
INTRAVENOUS | Status: DC | PRN
  Administered 2013-01-11: 150 mg via INTRAVENOUS

## 2013-01-11 MED ORDER — LIDOCAINE HCL 2 % IJ SOLN
INTRAMUSCULAR | Status: DC | PRN
  Administered 2013-01-11: 4 mL

## 2013-01-11 MED ORDER — METOCLOPRAMIDE HCL 5 MG/ML IJ SOLN: 10.0000 mg | Freq: Once | INTRAMUSCULAR | Status: DC | PRN

## 2013-01-11 MED ORDER — OXYCODONE HCL 5 MG PO TABS: 5.0000 mg | ORAL_TABLET | Freq: Once | ORAL | Status: DC | PRN

## 2013-01-11 MED ORDER — OXYCODONE-ACETAMINOPHEN 5-325 MG PO TABS: ORAL_TABLET | ORAL | Status: DC

## 2013-01-11 MED ORDER — ACETAMINOPHEN 325 MG PO TABS
650.0000 mg | ORAL_TABLET | Freq: Once | ORAL | Status: AC
  Administered 2013-01-11: 650 mg via ORAL

## 2013-01-11 MED ORDER — OXYCODONE HCL 5 MG/5ML PO SOLN: 5.0000 mg | Freq: Once | ORAL | Status: DC | PRN

## 2013-01-11 SURGICAL SUPPLY — 41 items
BANDAGE ADHESIVE 1X3 (GAUZE/BANDAGES/DRESSINGS) IMPLANT
BANDAGE ELASTIC 3 VELCRO ST LF (GAUZE/BANDAGES/DRESSINGS) IMPLANT
BLADE SURG 15 STRL LF DISP TIS (BLADE) ×1 IMPLANT
BLADE SURG 15 STRL SS (BLADE) ×1
BNDG COHESIVE 3X5 TAN STRL LF (GAUZE/BANDAGES/DRESSINGS) ×2 IMPLANT
BNDG ESMARK 4X9 LF (GAUZE/BANDAGES/DRESSINGS) ×2 IMPLANT
BRUSH SCRUB EZ PLAIN DRY (MISCELLANEOUS) ×2 IMPLANT
CLOTH BEACON ORANGE TIMEOUT ST (SAFETY) ×2 IMPLANT
CORDS BIPOLAR (ELECTRODE) ×2 IMPLANT
COVER MAYO STAND STRL (DRAPES) ×2 IMPLANT
COVER TABLE BACK 60X90 (DRAPES) ×2 IMPLANT
CUFF TOURNIQUET SINGLE 18IN (TOURNIQUET CUFF) ×2 IMPLANT
DECANTER SPIKE VIAL GLASS SM (MISCELLANEOUS) IMPLANT
DRAPE EXTREMITY T 121X128X90 (DRAPE) ×2 IMPLANT
DRAPE SURG 17X23 STRL (DRAPES) ×2 IMPLANT
GLOVE BIOGEL M STRL SZ7.5 (GLOVE) IMPLANT
GLOVE BIOGEL PI IND STRL 7.0 (GLOVE) ×1 IMPLANT
GLOVE BIOGEL PI INDICATOR 7.0 (GLOVE) ×1
GLOVE ECLIPSE 6.5 STRL STRAW (GLOVE) ×2 IMPLANT
GLOVE ORTHO TXT STRL SZ7.5 (GLOVE) ×2 IMPLANT
GOWN BRE IMP PREV XXLGXLNG (GOWN DISPOSABLE) ×2 IMPLANT
GOWN PREVENTION PLUS XLARGE (GOWN DISPOSABLE) ×2 IMPLANT
NDL SAFETY ECLIPSE 18X1.5 (NEEDLE) ×1 IMPLANT
NEEDLE 27GAX1X1/2 (NEEDLE) ×2 IMPLANT
NEEDLE HYPO 18GX1.5 SHARP (NEEDLE) ×1
PACK BASIN DAY SURGERY FS (CUSTOM PROCEDURE TRAY) ×2 IMPLANT
PAD CAST 3X4 CTTN HI CHSV (CAST SUPPLIES) ×1 IMPLANT
PADDING CAST ABS 4INX4YD NS (CAST SUPPLIES)
PADDING CAST ABS COTTON 4X4 ST (CAST SUPPLIES) IMPLANT
PADDING CAST COTTON 3X4 STRL (CAST SUPPLIES) ×1
SPLINT PLASTER CAST XFAST 3X15 (CAST SUPPLIES) ×5 IMPLANT
SPLINT PLASTER XTRA FASTSET 3X (CAST SUPPLIES) ×5
SPONGE GAUZE 4X4 12PLY (GAUZE/BANDAGES/DRESSINGS) ×2 IMPLANT
STOCKINETTE 4X48 STRL (DRAPES) ×2 IMPLANT
STRIP CLOSURE SKIN 1/2X4 (GAUZE/BANDAGES/DRESSINGS) ×2 IMPLANT
SUT PROLENE 3 0 PS 2 (SUTURE) ×2 IMPLANT
SYR 3ML 23GX1 SAFETY (SYRINGE) IMPLANT
SYR CONTROL 10ML LL (SYRINGE) ×2 IMPLANT
TOWEL OR 17X24 6PK STRL BLUE (TOWEL DISPOSABLE) ×2 IMPLANT
TRAY DSU PREP LF (CUSTOM PROCEDURE TRAY) ×2 IMPLANT
UNDERPAD 30X30 INCONTINENT (UNDERPADS AND DIAPERS) IMPLANT

## 2013-01-11 NOTE — Anesthesia Postprocedure Evaluation (Signed)
Anesthesia Post Note  Patient: [name]  Procedure(s) Performed: Procedure(s) (LRB): CARPAL TUNNEL RELEASE, RELEASE A-1 PULLEY RIGHT INDEX FINGER (Right)  Anesthesia type: General  Patient location: PACU  Post pain: Pain level controlled  Post assessment: Patient's Cardiovascular Status Stable  Last Vitals:  Filed Vitals:   01/11/13 1045  BP: 135/62  Pulse: 70  Temp:   Resp: 11    Post vital signs: Reviewed and stable  Level of consciousness: alert  Complications: No apparent anesthesia complications

## 2013-01-11 NOTE — Brief Op Note (Signed)
01/11/2013  9:42 AM  PATIENT:  [name]  77 y.o. female  PRE-OPERATIVE DIAGNOSIS:  RIGHT CARPAL TUNNEL SYNDROME, STENOSING TENOSYNOVITIS RIGHT INDEX FINGER  POST-OPERATIVE DIAGNOSIS:  RIGHT CARPAL TUNNEL SYNDROME, STENOSING TENOSYNOVITIS RIGHT INDEX FINGER  PROCEDURE:  Procedure(s): CARPAL TUNNEL RELEASE, RELEASE A-1 PULLEY RIGHT INDEX FINGER (Right)  SURGEON:  Surgeon(s) and Role:    * Wyn Forster., MD - Primary  PHYSICIAN ASSISTANT: surgical tech    ANESTHESIA:   general  EBL:  Total I/O In: 100 [I.V.:100] Out: 5 [Blood:5]  BLOOD ADMINISTERED:none  DRAINS: none   LOCAL MEDICATIONS USED:  XYLOCAINE   SPECIMEN:  No Specimen  DISPOSITION OF SPECIMEN:  N/A  COUNTS:  YES  TOURNIQUET:  * Missing tourniquet times found for documented tourniquets in log:  [id number] *  DICTATION: .Other Dictation: Dictation Number [id number]  PLAN OF CARE: Discharge to home after PACU  PATIENT DISPOSITION:  PACU - hemodynamically stable.   Delay start of Pharmacological VTE agent (>24hrs) due to surgical blood loss or risk of bleeding: not applicable

## 2013-01-11 NOTE — Transfer of Care (Signed)
Immediate Anesthesia Transfer of Care Note  Patient: [name]  Procedure(s) Performed: Procedure(s): CARPAL TUNNEL RELEASE, RELEASE A-1 PULLEY RIGHT INDEX FINGER (Right)  Patient Location: PACU  Anesthesia Type:General  Level of Consciousness: sedated  Airway & Oxygen Therapy: Patient Spontanous Breathing and Patient connected to face mask oxygen  Post-op Assessment: Report given to PACU RN and Post -op Vital signs reviewed and stable  Post vital signs: stable  Complications: No apparent anesthesia complications

## 2013-01-11 NOTE — Anesthesia Preprocedure Evaluation (Signed)
Anesthesia Evaluation  Patient identified by MRN, date of birth, ID band Patient awake    Reviewed: Allergy & Precautions, H&P , NPO status , Patient's Chart, lab work & pertinent test results, reviewed documented beta blocker date and time   Airway Mallampati: II TM Distance: >3 FB Neck ROM: full    Dental   Pulmonary shortness of breath and with exertion,  breath sounds clear to auscultation        Cardiovascular hypertension, On Medications Rhythm:regular     Neuro/Psych  Headaches, negative psych ROS   GI/Hepatic negative GI ROS, Neg liver ROS,   Endo/Other  negative endocrine ROS  Renal/GU negative Renal ROS  negative genitourinary   Musculoskeletal   Abdominal   Peds  Hematology negative hematology ROS (+)   Anesthesia Other Findings See surgeon's H&P   Reproductive/Obstetrics negative OB ROS                           Anesthesia Physical Anesthesia Plan  ASA: II  Anesthesia Plan: General   Post-op Pain Management:    Induction: Intravenous  Airway Management Planned: LMA  Additional Equipment:   Intra-op Plan:   Post-operative Plan:   Informed Consent: I have reviewed the patients History and Physical, chart, labs and discussed the procedure including the risks, benefits and alternatives for the proposed anesthesia with the patient or authorized representative who has indicated his/her understanding and acceptance.   Dental Advisory Given  Plan Discussed with: CRNA and Surgeon  Anesthesia Plan Comments:         Anesthesia Quick Evaluation

## 2013-01-11 NOTE — Discharge Instructions (Addendum)
[facility] Instructions °Hand Surgery ° °Wound Care: °Keep your hand elevated above the level of your heart.  Do not allow it to dangle by your side.  Keep the dressing dry and do not remove it unless your doctor advises you to do so.  He will usually change it at the time of your post-op visit.  Moving your fingers is advised to stimulate circulation but will depend on the site of your surgery.  If you have a splint applied, your doctor will advise you regarding movement. ° °Activity: °Do not drive or operate machinery today.  Rest today and then you may return to your normal activity and work as indicated by your physician. ° °Diet:  °Drink liquids today or eat a light diet.  You may resume a regular diet tomorrow.   ° °General expectations: °Pain for two to three days. °Fingers may become slightly swollen. ° °Call your doctor if any of the following occur: °Severe pain not relieved by pain medication. °Elevated temperature. °Dressing soaked with blood. °Inability to move fingers. °White or bluish color to fingers. ° ° ° °Post Anesthesia Home Care Instructions ° °Activity: °Get plenty of rest for the remainder of the day. A responsible adult should stay with you for 24 hours following the procedure.  °For the next 24 hours, DO NOT: °-Drive a car °-Operate machinery °-Drink alcoholic beverages °-Take any medication unless instructed by your physician °-Make any legal decisions or sign important papers. ° °Meals: °Start with liquid foods such as gelatin or soup. Progress to regular foods as tolerated. Avoid greasy, spicy, heavy foods. If nausea and/or vomiting occur, drink only clear liquids until the nausea and/or vomiting subsides. Call your physician if vomiting continues. ° °Special Instructions/Symptoms: °Your throat may feel dry or sore from the anesthesia or the breathing tube placed in your throat during surgery. If this causes discomfort, gargle with warm salt water. The discomfort should disappear within  24 hours. ° ° ° °Post Anesthesia Home Care Instructions ° °Activity: °Get plenty of rest for the remainder of the day. A responsible adult should stay with you for 24 hours following the procedure.  °For the next 24 hours, DO NOT: °-Drive a car °-Operate machinery °-Drink alcoholic beverages °-Take any medication unless instructed by your physician °-Make any legal decisions or sign important papers. ° °Meals: °Start with liquid foods such as gelatin or soup. Progress to regular foods as tolerated. Avoid greasy, spicy, heavy foods. If nausea and/or vomiting occur, drink only clear liquids until the nausea and/or vomiting subsides. Call your physician if vomiting continues. ° °Special Instructions/Symptoms: °Your throat may feel dry or sore from the anesthesia or the breathing tube placed in your throat during surgery. If this causes discomfort, gargle with warm salt water. The discomfort should disappear within 24 hours. ° °

## 2013-01-11 NOTE — Op Note (Signed)
[id number] 

## 2013-01-12 ENCOUNTER — Encounter (HOSPITAL_BASED_OUTPATIENT_CLINIC_OR_DEPARTMENT_OTHER): Payer: Self-pay | Admitting: Orthopedic Surgery

## 2013-01-12 NOTE — Op Note (Signed)
[name], [name]                 ACCOUNT NO.:  [id number]  MEDICAL RECORD NO.:  [id number]  LOCATION:                               FACILITY:  [facility]  PHYSICIAN:  Kaitlyn Fitch. Talea Manges, M.D. DATE OF BIRTH:  1935-09-09  DATE OF PROCEDURE:  01/11/2013 DATE OF DISCHARGE:  01/11/2013                              OPERATIVE REPORT   PREOPERATIVE DIAGNOSIS:  Chronic entrapped neuropathy, median nerve, right carpal tunnel, also stenosing tenosynovitis of right index finger, A1 pulley.  POSTOPERATIVE DIAGNOSIS:  Chronic entrapped neuropathy, median nerve, right carpal tunnel, also stenosing tenosynovitis of right index finger, A1 pulley.  OPERATION: 1. Release of right transverse carpal ligament. 2. Release of right index finger A1 pulley with incidental     synovectomy.  OPERATING SURGEON:  Kaitlyn Fitch. Billijo Dilling, MD  ASSISTANT:  Surgical technician.  ANESTHESIA:  General by LMA supplemented by 2% lidocaine, wound block of the carpal tunnel incision and the index A1 pulley release incision.  SUPERVISING ANESTHESIOLOGIST:  Janetta Hora. Gelene Mink, MD  INDICATIONS:  [name] is a 77 year old woman well acquainted with our practice.  She presented for chronic numbness of her right hand.  We had noted intermittent triggering of her thumb and fingers.  Her thumb responded quite well to the steroid injection provided at the office. She had persistent triggering of the index finger.  In the holding area, we provided detailed informed consent and re-examined her hand thoroughly.  She was noted to have locking stenosing tenosynovitis of index finger, therefore her surgical permit was amended to add release of the index finger A1 pulley as well as release of right transverse carpal ligament.  After informed consent, she was brought to the operating room at this time.  Preoperatively, she was interviewed by Dr. Gelene Mink.  Anesthetic choices were offered.  She selected general anesthesia by  LMA.  PROCEDURE IN DETAIL:  [name] was brought to room 6 of [facility] and placed in supine position on the operating table.  Following detailed anesthesia and informed consent, general anesthesia by LMA technique was induced under Dr. Thornton Dales direct supervision.  The right hand and arm were then prepped with Betadine soap and solution, sterilely draped.  A pneumatic tourniquet was applied to the proximal right brachium.  Following exsanguination of right arm with Esmarch bandage, arterial tourniquet was inflated 220 mmHg.  Following routine surgical time-out, procedure commenced with an oblique incision palpably thickened A1 pulley of the right index finger. Subcutaneous tissues were carefully divided taking care to release the palmar fascia.  Bleeding points were electrocauterized with bipolar current followed by identification of the A1 pulley.  There was a small A0 pulley noted as well.  The A1 pulley was split with scissors as was the A0 pulley.  The flexor tendons delivered and an incidental synovectomy accomplished with scissors.  Thereafter, free range of motion of the index finger was recovered.  The wound was repaired with intradermal 3-0 Prolene suture.  Attention was then directed to the proximal palm.  A short incision was fashioned in line of the ring finger.  Subcutaneous tissues were carefully divided revealing the palmar fascia.  This split longitudinally in  line of its fibers to reveal the common sensory branch of the median nerve.  The distal margin transcarpal ligament was identified, and the canal sounded with a Insurance risk surveyor.  The transverse carpal ligament was released along its ulnar border with scissors extending into the distal forearm.  Bleeding points along the margin of the released ligament were electrocauterized with bipolar current followed by repair of the skin with intradermal 3-0 Prolene suture.  Both wound sites were  infiltrated with 2% lidocaine for postoperative comfort.  The hand was then dressed with sterile gauze, sterile Webril, and a volar plaster splint, secured with Coban.  There were no apparent complications.  For aftercare, [name] was provided prescription for Percocet 5 mg 1 p.o. q.4-6 h. p.r.n. pain, 20 tabs without refill.  We will see her back for followup in our office in 1 week.     [name], M.D.   ______________________________ [name], M.D.    RVS/MEDQ  D:  01/11/2013  T:  01/12/2013  Job:  [id number]

## 2013-01-25 ENCOUNTER — Other Ambulatory Visit: Payer: Self-pay | Admitting: Internal Medicine

## 2013-01-25 ENCOUNTER — Telehealth: Payer: Self-pay | Admitting: *Deleted

## 2013-01-25 DIAGNOSIS — Z853 Personal history of malignant neoplasm of breast: Secondary | ICD-10-CM

## 2013-01-25 DIAGNOSIS — Z9889 Other specified postprocedural states: Secondary | ICD-10-CM

## 2013-01-25 DIAGNOSIS — R921 Mammographic calcification found on diagnostic imaging of breast: Secondary | ICD-10-CM

## 2013-01-25 NOTE — Telephone Encounter (Signed)
Pt called fussing about her appts. She states that I placed appt on her schedule and did not make her aware. She found out about her appts at the hand doctor. i then confirmed her appts for 02/28/13 @ 1:30pm for labs and ov@ 2pm....td

## 2013-02-28 ENCOUNTER — Encounter: Payer: Self-pay | Admitting: Family

## 2013-02-28 ENCOUNTER — Telehealth: Payer: Self-pay | Admitting: Oncology

## 2013-02-28 ENCOUNTER — Other Ambulatory Visit (HOSPITAL_BASED_OUTPATIENT_CLINIC_OR_DEPARTMENT_OTHER): Payer: Medicare Other | Admitting: Lab

## 2013-02-28 ENCOUNTER — Ambulatory Visit (HOSPITAL_BASED_OUTPATIENT_CLINIC_OR_DEPARTMENT_OTHER): Payer: Medicare Other | Admitting: Family

## 2013-02-28 VITALS — BP 157/87 | HR 73 | Temp 98.2°F | Resp 18 | Ht 64.0 in | Wt 185.0 lb

## 2013-02-28 DIAGNOSIS — C50919 Malignant neoplasm of unspecified site of unspecified female breast: Secondary | ICD-10-CM | POA: Diagnosis not present

## 2013-02-28 DIAGNOSIS — M899 Disorder of bone, unspecified: Secondary | ICD-10-CM

## 2013-02-28 DIAGNOSIS — M8580 Other specified disorders of bone density and structure, unspecified site: Secondary | ICD-10-CM

## 2013-02-28 DIAGNOSIS — G43909 Migraine, unspecified, not intractable, without status migrainosus: Secondary | ICD-10-CM

## 2013-02-28 DIAGNOSIS — Z170 Estrogen receptor positive status [ER+]: Secondary | ICD-10-CM

## 2013-02-28 DIAGNOSIS — C50912 Malignant neoplasm of unspecified site of left female breast: Secondary | ICD-10-CM

## 2013-02-28 DIAGNOSIS — Z853 Personal history of malignant neoplasm of breast: Secondary | ICD-10-CM

## 2013-02-28 LAB — CBC WITH DIFFERENTIAL/PLATELET
BASO%: 1.5 % (ref 0.0–2.0)
Basophils Absolute: 0.1 10*3/uL (ref 0.0–0.1)
EOS%: 1.4 % (ref 0.0–7.0)
HCT: 38.1 % (ref 34.8–46.6)
HGB: 12.9 g/dL (ref 11.6–15.9)
LYMPH%: 29.2 % (ref 14.0–49.7)
MCH: 30 pg (ref 25.1–34.0)
MCHC: 33.8 g/dL (ref 31.5–36.0)
MCV: 88.8 fL (ref 79.5–101.0)
MONO%: 8.8 % (ref 0.0–14.0)
NEUT%: 59.1 % (ref 38.4–76.8)
Platelets: 302 10*3/uL (ref 145–400)
lymph#: 1.8 10*3/uL (ref 0.9–3.3)

## 2013-02-28 LAB — COMPREHENSIVE METABOLIC PANEL (CC13)
AST: 19 U/L (ref 5–34)
Albumin: 3.8 g/dL (ref 3.5–5.0)
Alkaline Phosphatase: 92 U/L (ref 40–150)
BUN: 20.2 mg/dL (ref 7.0–26.0)
Calcium: 9.5 mg/dL (ref 8.4–10.4)
Creatinine: 0.9 mg/dL (ref 0.6–1.1)
Glucose: 86 mg/dl (ref 70–140)
Potassium: 3.7 mEq/L (ref 3.5–5.1)

## 2013-02-28 MED ORDER — LETROZOLE 2.5 MG PO TABS: 2.5000 mg | ORAL_TABLET | Freq: Every day | ORAL | Status: DC

## 2013-02-28 NOTE — Progress Notes (Signed)
[name]  Telephone:([phone]) [phone] Fax:([phone]) [phone]  OFFICE PROGRESS NOTE    ID: [name]   DOB: December 25, 1935  MR#: [id number]  JYN#:[id number]   PCP: [name], MD SU: [name], MD RAD ONC: [name], MD OTHER MD:  [name], MD   HISTORY OF PRESENT ILLNESS: From Dr. Theron Arista Rubin's New Patient Evaluation Dated 01/02/2010: "This is a delightful 77 year old woman here today with her husband and her daughter, [name], for evaluation and treatment of breast cancer.  This woman has been in reasonably good health.  She has undergone annual screening mammography.  A mammogram performed on 11/29/2009 showed a focal asymmetry at 3 o'clock position in the left breast.  Additional views are recommended.  On 12/04/2009, additional views with ultrasound of the breast were performed, which showed ultimately to be an area of heterogeneous echogenicity at 3 o'clock position.  No discrete mass was seen.  Biopsy was recommended.  A biopsy performed on 12/19/2009 showed invasive ductal cancer of intermediate grade.  No evidence of angiolymphatic invasion.  The tumor was ER/PR positive 100% respectively, proliferative index 63%.  No amplification by HER-2 by [facility].  The patient has had a preoperative MRI scan, which essentially showed this mass to be somewhat larger over the area of 12.9 x 2.5 x 2.2 cm.  No enlarged lymph nodes were seen."  Her subsequent history is as detailed below.   INTERVAL HISTORY: [name] was seen today for follow up of invasive ductal carcinoma of the left breast, diagnosed in 11/2009. The patient is accompanied by her husband, [name] for today's office visit. The patient was last seen by our office on 08/09/2012.  Her interval history is significant for undergoing right upper extremity carpal tunnel release and trigger finger release with Dr. Teressa Senter on 01/11/2013.  She reports her right hand functionality has greatly increased  since undergoing surgery.  [name] continues to experience severe migraine headaches which are appear to be cyclical in nature and occur approximately every 5 1/2 months and lasts for 8-9 days.  Her headaches have classic migraine symptomatology including photophobia, phonophobia and nausea without emesis.  She is seeing Dr. Earl Gala on 03/08/2013 to discuss her ongoing migraine headaches.  She states the Imitrex we prescribed to her during her last office visit does not provide her migraine headache relief.  I suggested that her migraine headaches may be seasonal and a prescription allergy medicine in addition to migraine medication may help alleviate her symptoms.   REVIEW OF SYSTEMS: A 10 point review of systems was completed and is negative except as noted above.  [name] denies any other symptomatology including fatigue, fever or chills, vision changes, swollen glands, cough or shortness of breath, chest pain or discomfort, nausea (in the absence of migraine headaches), vomiting, diarrhea, constipation, change in urinary or bowel habits, arthralgias/myalgias, unusual bleeding/bruising or any other symptomatology.  PAST MEDICAL HISTORY: Past Medical History  Diagnosis Date  . Breast cancer   . Migraine   . Hypertension   . Menorrhagia   . Stress incontinence   . Gall stones   . Shortness of breath     once a year- gets checked by Dr.    Ulla Potash SURGICAL HISTORY: Past Surgical History  Procedure Laterality Date  . Abdominal hysterectomy      Ovaries retained  . Breast lumpectomy Left 01/02/10  . Bladder surgery      Bladder Tack  . Cholecystectomy    .  Shoulder surgery Bilateral   . Breast mass excision      Benign lump removal  . Carpal tunnel release Right 01/11/2013    Procedure: CARPAL TUNNEL RELEASE, RELEASE A-1 PULLEY RIGHT INDEX FINGER;  Surgeon: Wyn Forster., MD;  [facility];  Service: Orthopedics;  Laterality: Right;    FAMILY  HISTORY Family History  Problem Relation Age of Onset  . Heart Problems Mother   . Hypertension Mother   . Diabetes Father   . Cancer Sister     Lung and Ovarian  . Diabetes Sister   . Heart Problems Brother   . Diabetes Brother   . Diabetes Sister   . Diabetes Sister      GYNECOLOGIC HISTORY: G2, P2.  Menarche age 89.  Hysterectomy at age 77.  She used hormone therapy for a total of 20 years, she stopped hormone therapy in 2005.   SOCIAL HISTORY: Married for 56 years, has two adult children.  She did office work until retirement.  Her husband worked for [employer].  They have five grandchildren.   ADVANCED DIRECTIVES: Not on file  HEALTH MAINTENANCE: History  Substance Use Topics  . Smoking status: Never Smoker   . Smokeless tobacco: Never Used  . Alcohol Use: No    Colonoscopy: Not on file PAP: Not on file Bone density: Bone density scan in 02/2012 showed a T score of -1.6 (osteopenia). Lipid panel: Not on file   Allergies  Allergen Reactions  . Amoxicillin     Current Outpatient Prescriptions  Medication Sig Dispense Refill  . calcium carbonate (OS-CAL) 600 MG TABS Take 600 mg by mouth daily with breakfast.       . cholecalciferol (VITAMIN D) 1000 UNITS tablet Take 1,000 Units by mouth daily.       Marland Kitchen ketoconazole (NIZORAL) 2 % cream Apply 1 application topically daily.       Marland Kitchen KLOR-CON M20 20 MEQ tablet Take 20 mEq by mouth daily.       Marland Kitchen letrozole (FEMARA) 2.5 MG tablet Take 1 tablet (2.5 mg total) by mouth daily.  90 tablet  4  . metroNIDAZOLE (METROCREAM) 0.75 % cream Apply 1 application topically daily.       . Multiple Vitamin (MULTIVITAMIN) tablet Take 1 tablet by mouth daily.      . Triamterene-HCTZ (MAXZIDE PO) Take 25 mg by mouth daily.       No current facility-administered medications for this visit.    OBJECTIVE: Filed Vitals:   02/28/13 1408  BP: 157/87  Pulse: 73  Temp: 98.2 F (36.8 C)  Resp: 18     Body mass index is 31.74 kg/(m^2).        ECOG FS: 1 - Symptomatic but completely ambulatory   General appearance: Alert, cooperative, well nourished, no apparent distress Head: Normocephalic, without obvious abnormality, atraumatic Eyes: Arcus senilis, PERRLA, EOMI Nose: Nares, septum and mucosa are normal, no drainage or sinus tenderness Neck: No adenopathy, supple, symmetrical, trachea midline, no tenderness Resp: Clear to auscultation bilaterally, no wheezes/rales/rhonchi Cardio: Regular rate and rhythm, S1, S2 normal, no murmur, click, rub or gallop, no edema Breasts:  Pendulous bilaterally, left breast has well-healed surgical scar, glandular tissue bilaterally, left breast nodularity at 6:30 position and by surgical scar, right breast nodularity at 6:00 position, bilateral axillary fullness, firm inframammary and medial mammary ridges, no nipple inversion, no nipple discharge GI: Soft, distended, non-tender, hypoactive bowel sounds, no organomegaly Skin: No rashes/lesions, skin warm and dry,  no erythematous areas, no cyanosis, seborrheic keratosis on face/abdominal area/upper extremities, split/irregular nail beds on several fingers M/S:  Atraumatic, normal strength in all extremities, normal range of motion, no clubbing  Lymph nodes: Cervical, supraclavicular, and axillary nodes normal Neurologic: Grossly normal, cranial nerves II through XII intact, alert and oriented x 3 Psych: Appropriate affect   LAB RESULTS: Lab Results  Component Value Date   WBC 6.2 02/28/2013   NEUTROABS 3.7 02/28/2013   HGB 12.9 02/28/2013   HCT 38.1 02/28/2013   MCV 88.8 02/28/2013   PLT 302 02/28/2013      Chemistry      Component Value Date/Time   NA 137 02/28/2013 1337   NA 132* 01/07/2013 1622   K 3.7 02/28/2013 1337   K 3.9 01/07/2013 1622   CL 94* 01/07/2013 1622   CL 99 08/09/2012 1546   CO2 27 02/28/2013 1337   CO2 27 01/07/2013 1622   BUN 20.2 02/28/2013 1337   BUN 20 01/07/2013 1622   CREATININE 0.9 02/28/2013 1337    CREATININE 0.93 01/07/2013 1622      Component Value Date/Time   CALCIUM 9.5 02/28/2013 1337   CALCIUM 9.9 01/07/2013 1622   ALKPHOS 92 02/28/2013 1337   ALKPHOS 69 12/30/2011 1448   AST 19 02/28/2013 1337   AST 22 12/30/2011 1448   ALT 23 02/28/2013 1337   ALT 27 12/30/2011 1448   BILITOT 0.35 02/28/2013 1337   BILITOT 0.2* 12/30/2011 1448       Lab Results  Component Value Date   LABCA2 22 01/02/2010    Urinalysis    Component Value Date/Time   COLORURINE YELLOW 05/28/2008 1310   APPEARANCEUR CLOUDY* 05/28/2008 1310   LABSPEC 1.010 05/28/2008 1310   PHURINE 6.0 05/28/2008 1310   GLUCOSEU NEGATIVE 05/28/2008 1310   HGBUR TRACE* 05/28/2008 1310   BILIRUBINUR NEGATIVE 05/28/2008 1310   KETONESUR NEGATIVE 05/28/2008 1310   PROTEINUR NEGATIVE 05/28/2008 1310   UROBILINOGEN 0.2 05/28/2008 1310   NITRITE NEGATIVE 05/28/2008 1310   LEUKOCYTESUR LARGE* 05/28/2008 1310    STUDIES: No results found.  ASSESSMENT: 77 y.o. [employer], [place] woman:  1. Status post abnormal mammogram which showed a mass in her left breast on 08/30/2009.  She then underwent a breast ultrasound on 12/04/2009. The patient had a left breast biopsy on 12/19/2009 which showed invasive ductal carcinoma, ER  positive, PR positive, Ki-67 63%, HER-2/neu negative. The patient had MRI on 12/26/2009 which showed a spiculated 4.9 cm mass consistent with known malignancy in the lateral portion of the left breast.    2. The patient started neoadjuvant antiestrogen therapy with Femara on 01/02/2010.   3. On 06/24/2010 the patient had a left breast lumpectomy with sentinel node biopsy for a stage I, T1c N0, invasive ductal carcinoma, multifocal involving the superior margin with high-grade DCIS with calcifications and necrosis multifocal, grade 2, ER 100%, PR 100%, Ki-67 63%, HER-2/neu negative, and 0/1 positive lymph nodes. The patient had a reexcision of the superior margin on 07/11/2010 in which benign fibrocystic  changes were found, no malignancy identified, and the final margins were clear.  4. Status post radiation therapy from 08/21/2010-10/03/2010.  5. Continuation of anti-estrogen therapy with Femara since 12/2009.  6. Abnormal mammogram on 02/26/2012 which showed a solitary linear calcification in a left lumpectomy site, likely representing a developing fat necrosis. Diagnostic left mammogram in 6 months with magnification views.  7. Chronic cyclical migraine headaches  8. Osteopenia     PLAN: Mrs.  Vaquerano will continue antiestrogen therapy with Femara for a total of 5 years (until 12/2014).  An electronic prescription for Femara 2.5 mg by mouth daily #90 with 4 refills was sent to the patient's pharmacy.  She is scheduled to have a bilateral digital diagnostic mammogram tomorrow.  Mrs. Mezera was encouraged to continue taking calcium 600 mg by mouth daily in addition to vitamin D 3 1000 IUs by mouth daily for osteopenia.  Daily wear bearing exercise (walking as tolerated) was also encouraged.  She will be due for her next bone density scan in 02/2014.  Mrs. Wickwire is being followed by Dr. Earl Gala for migraine headaches.  She has an appointment to see Dr. Earl Gala on 03/08/2013.  We plan to see Mrs. Kumagai again in 08/2013 at which time we will check a CBC, CMP and vitamin D level.   All questions were answered.  Mr. and Mrs. Austad were encouraged to contact us in the interim with any problems, questions or concerns.    Larina Bras, NP-C 02/28/2013, 4:02 PM

## 2013-02-28 NOTE — Patient Instructions (Signed)
Please contact us at [phone] if you have any questions or concerns.  Please continue to do well and enjoy life!!!  Get plenty of rest, drink plenty of water, exercise daily (walking), eat a balanced diet.  Take calcium 600 mg daily in addition to vitamin D3 1000 IUs daily.   Complete monthly self-breast examinations.  Have a clinical breast exam by a physician every year.  Have your mammogram completed every year.  Results for orders placed in visit on 02/28/13 (from the past 72 hour(s))  CBC WITH DIFFERENTIAL     Status: None   Collection Time    02/28/13  1:36 PM      Result Value Range   WBC 6.2  3.9 - 10.3 10e3/uL   NEUT# 3.7  1.5 - 6.5 10e3/uL   HGB 12.9  11.6 - 15.9 g/dL   HCT 16.1  09.6 - 04.5 %   Platelets 302  145 - 400 10e3/uL   MCV 88.8  79.5 - 101.0 fL   MCH 30.0  25.1 - 34.0 pg   MCHC 33.8  31.5 - 36.0 g/dL   RBC 4.09  8.11 - 9.14 10e6/uL   RDW 13.4  11.2 - 14.5 %   lymph# 1.8  0.9 - 3.3 10e3/uL   MONO# 0.5  0.1 - 0.9 10e3/uL   Eosinophils Absolute 0.1  0.0 - 0.5 10e3/uL   Basophils Absolute 0.1  0.0 - 0.1 10e3/uL   NEUT% 59.1  38.4 - 76.8 %   LYMPH% 29.2  14.0 - 49.7 %   MONO% 8.8  0.0 - 14.0 %   EOS% 1.4  0.0 - 7.0 %   BASO% 1.5  0.0 - 2.0 %  COMPREHENSIVE METABOLIC PANEL (CC13)     Status: None   Collection Time    02/28/13  1:37 PM      Result Value Range   Sodium 137  136 - 145 mEq/L   Potassium 3.7  3.5 - 5.1 mEq/L   Chloride 102  98 - 109 mEq/L   CO2 27  22 - 29 mEq/L   Glucose 86  70 - 140 mg/dl   BUN 78.2  7.0 - 95.6 mg/dL   Creatinine 0.9  0.6 - 1.1 mg/dL   Total Bilirubin 2.13  0.20 - 1.20 mg/dL   Alkaline Phosphatase 92  40 - 150 U/L   AST 19  5 - 34 U/L   ALT 23  0 - 55 U/L   Total Protein 6.9  6.4 - 8.3 g/dL   Albumin 3.8  3.5 - 5.0 g/dL   Calcium 9.5  8.4 - 08.6 mg/dL   Anion Gap 8  3 - 11 mEq/L

## 2013-02-28 NOTE — Telephone Encounter (Signed)
, °

## 2013-03-01 ENCOUNTER — Ambulatory Visit
Admission: RE | Admit: 2013-03-01 | Discharge: 2013-03-01 | Disposition: A | Discharge status: Home / Self Care | Payer: Medicare Other | Source: Ambulatory Visit | Attending: Family | Admitting: Family

## 2013-03-01 ENCOUNTER — Telehealth: Payer: Self-pay | Admitting: Family

## 2013-03-01 DIAGNOSIS — Z853 Personal history of malignant neoplasm of breast: Secondary | ICD-10-CM | POA: Diagnosis not present

## 2013-03-01 DIAGNOSIS — M8580 Other specified disorders of bone density and structure, unspecified site: Secondary | ICD-10-CM

## 2013-03-01 DIAGNOSIS — C50912 Malignant neoplasm of unspecified site of left female breast: Secondary | ICD-10-CM

## 2013-03-01 DIAGNOSIS — G43909 Migraine, unspecified, not intractable, without status migrainosus: Secondary | ICD-10-CM

## 2013-03-01 NOTE — Telephone Encounter (Signed)
Spoke to [name].  Reviewed today's mammogram results with her in which bilateral digital diagnostic mammogram showed no evidence of malignancy in either breast, with stable lumpectomy changes in the left breast.  [name] voiced understanding.

## 2013-03-08 DIAGNOSIS — N183 Chronic kidney disease, stage 3 unspecified: Secondary | ICD-10-CM | POA: Diagnosis not present

## 2013-03-08 DIAGNOSIS — I10 Essential (primary) hypertension: Secondary | ICD-10-CM | POA: Diagnosis not present

## 2013-05-31 ENCOUNTER — Other Ambulatory Visit: Payer: Self-pay | Admitting: *Deleted

## 2013-05-31 DIAGNOSIS — Z853 Personal history of malignant neoplasm of breast: Secondary | ICD-10-CM

## 2013-05-31 MED ORDER — LETROZOLE 2.5 MG PO TABS: 2.5000 mg | ORAL_TABLET | Freq: Every day | ORAL | Status: DC

## 2013-07-19 DIAGNOSIS — M722 Plantar fascial fibromatosis: Secondary | ICD-10-CM | POA: Diagnosis not present

## 2013-07-20 DIAGNOSIS — I10 Essential (primary) hypertension: Secondary | ICD-10-CM | POA: Diagnosis not present

## 2013-07-20 DIAGNOSIS — N183 Chronic kidney disease, stage 3 unspecified: Secondary | ICD-10-CM | POA: Diagnosis not present

## 2013-07-26 DIAGNOSIS — N390 Urinary tract infection, site not specified: Secondary | ICD-10-CM | POA: Diagnosis not present

## 2013-08-09 DIAGNOSIS — M25579 Pain in unspecified ankle and joints of unspecified foot: Secondary | ICD-10-CM | POA: Diagnosis not present

## 2013-08-09 DIAGNOSIS — M722 Plantar fascial fibromatosis: Secondary | ICD-10-CM | POA: Diagnosis not present

## 2013-08-26 ENCOUNTER — Telehealth: Payer: Self-pay | Admitting: Oncology

## 2013-08-26 ENCOUNTER — Other Ambulatory Visit: Payer: Self-pay | Admitting: *Deleted

## 2013-08-26 DIAGNOSIS — C50919 Malignant neoplasm of unspecified site of unspecified female breast: Secondary | ICD-10-CM

## 2013-08-26 NOTE — Telephone Encounter (Signed)
Pt called and complained that she just got called and notified of this appt. Appt was made in October and given to pt same day when pt was here. Gave pt appt for lab and MD for 4/13th

## 2013-08-29 ENCOUNTER — Other Ambulatory Visit (HOSPITAL_BASED_OUTPATIENT_CLINIC_OR_DEPARTMENT_OTHER): Payer: Medicare Other

## 2013-08-29 ENCOUNTER — Telehealth: Payer: Self-pay | Admitting: Oncology

## 2013-08-29 ENCOUNTER — Ambulatory Visit (HOSPITAL_BASED_OUTPATIENT_CLINIC_OR_DEPARTMENT_OTHER): Payer: Medicare Other | Admitting: Oncology

## 2013-08-29 VITALS — BP 126/72 | HR 87 | Temp 98.4°F | Resp 18 | Ht 64.0 in | Wt 181.8 lb

## 2013-08-29 DIAGNOSIS — C50919 Malignant neoplasm of unspecified site of unspecified female breast: Secondary | ICD-10-CM

## 2013-08-29 DIAGNOSIS — M899 Disorder of bone, unspecified: Secondary | ICD-10-CM

## 2013-08-29 DIAGNOSIS — Z170 Estrogen receptor positive status [ER+]: Secondary | ICD-10-CM

## 2013-08-29 DIAGNOSIS — M949 Disorder of cartilage, unspecified: Secondary | ICD-10-CM | POA: Diagnosis not present

## 2013-08-29 DIAGNOSIS — Z853 Personal history of malignant neoplasm of breast: Secondary | ICD-10-CM

## 2013-08-29 DIAGNOSIS — M8580 Other specified disorders of bone density and structure, unspecified site: Secondary | ICD-10-CM

## 2013-08-29 LAB — COMPREHENSIVE METABOLIC PANEL (CC13)
ALK PHOS: 101 U/L (ref 40–150)
ALT: 22 U/L (ref 0–55)
AST: 20 U/L (ref 5–34)
Albumin: 3.8 g/dL (ref 3.5–5.0)
Anion Gap: 10 mEq/L (ref 3–11)
BILIRUBIN TOTAL: 0.28 mg/dL (ref 0.20–1.20)
BUN: 19.1 mg/dL (ref 7.0–26.0)
CHLORIDE: 99 meq/L (ref 98–109)
CO2: 27 mEq/L (ref 22–29)
CREATININE: 1 mg/dL (ref 0.6–1.1)
Calcium: 9.6 mg/dL (ref 8.4–10.4)
Glucose: 129 mg/dl (ref 70–140)
Potassium: 3.5 mEq/L (ref 3.5–5.1)
Sodium: 136 mEq/L (ref 136–145)
Total Protein: 7 g/dL (ref 6.4–8.3)

## 2013-08-29 LAB — CBC WITH DIFFERENTIAL/PLATELET
BASO%: 0.7 % (ref 0.0–2.0)
Basophils Absolute: 0 10*3/uL (ref 0.0–0.1)
EOS%: 1.8 % (ref 0.0–7.0)
Eosinophils Absolute: 0.1 10*3/uL (ref 0.0–0.5)
HEMATOCRIT: 38.3 % (ref 34.8–46.6)
HGB: 13 g/dL (ref 11.6–15.9)
LYMPH#: 1.5 10*3/uL (ref 0.9–3.3)
LYMPH%: 26.5 % (ref 14.0–49.7)
MCH: 30.2 pg (ref 25.1–34.0)
MCHC: 33.9 g/dL (ref 31.5–36.0)
MCV: 89.1 fL (ref 79.5–101.0)
MONO#: 0.5 10*3/uL (ref 0.1–0.9)
MONO%: 8.5 % (ref 0.0–14.0)
NEUT#: 3.6 10*3/uL (ref 1.5–6.5)
NEUT%: 62.5 % (ref 38.4–76.8)
Platelets: 320 10*3/uL (ref 145–400)
RBC: 4.3 10*6/uL (ref 3.70–5.45)
RDW: 13.3 % (ref 11.2–14.5)
WBC: 5.7 10*3/uL (ref 3.9–10.3)

## 2013-08-29 MED ORDER — LETROZOLE 2.5 MG PO TABS: 2.5000 mg | ORAL_TABLET | Freq: Every day | ORAL | Status: DC

## 2013-08-29 NOTE — Telephone Encounter (Signed)
, °

## 2013-08-29 NOTE — Progress Notes (Signed)
[place]  Telephone:(336) [phone] Fax:(336) [phone]  OFFICE PROGRESS NOTE    ID: Tiburcio Bash   DOB: July 23, 1935  MR#: [id number]  PXT#:[id number]   PCP: Horton Finer, MD SU: Rolm Bookbinder, MD RAD ONC: Rexene Edison, MD OTHER MD:  Youlanda Mighty. Sypher, MD   HISTORY OF PRESENT ILLNESS: From Dr. Collier Salina Rubin's New Patient Evaluation Dated 01/02/2010:  "This is a delightful 78 year old woman here today with her husband and her daughter, Chong Sicilian, for evaluation and treatment of breast cancer.  This woman has been in reasonably good health.  She has undergone annual screening mammography.  A mammogram performed on 11/29/2009 showed a focal asymmetry at 3 o'clock position in the left breast.  Additional views are recommended.  On 12/04/2009, additional views with ultrasound of the breast were performed, which showed ultimately to be an area of heterogeneous echogenicity at 3 o'clock position.  No discrete mass was seen.  Biopsy was recommended.  A biopsy performed on 12/19/2009 showed invasive ductal cancer of intermediate grade.  No evidence of angiolymphatic invasion.  The tumor was ER/PR positive 100% respectively, proliferative index 63%.  No amplification by HER-2 by CISH.  The patient has had a preoperative MRI scan, which essentially showed this mass to be somewhat larger over the area of 12.9 x 2.5 x 2.2 cm.  No enlarged lymph nodes were seen."    Her subsequent history is as detailed below.   INTERVAL HISTORY: Andreina returns today for followup of her breast cancer. The interval history is generally unremarkable. She continues to take letrozole with no significant side effects that she is aware of and particularly no hot flashes and no problems with vaginal dryness.  REVIEW OF SYSTEMS: Sometimes she feels fatigued, but she is not exercising regularly although she has a treadmill at home. She has seasonal allergies. Her dentures don't fit as well as she would like.  She has a history of migraines but she has had none so far this year. The only symptoms she reports are a little pain in the anterior chest just to the right of the sternum and a little pain on the left side under the left breast laterally a detailed review of systems today was otherwise entirely negative  PAST MEDICAL HISTORY: Past Medical History  Diagnosis Date  . Breast cancer   . Migraine   . Hypertension   . Menorrhagia   . Stress incontinence   . Gall stones   . Shortness of breath     once a year- gets checked by Dr.    Vennie Homans SURGICAL HISTORY: Past Surgical History  Procedure Laterality Date  . Abdominal hysterectomy      Ovaries retained  . Breast lumpectomy Left 01/02/10  . Bladder surgery      Bladder Tack  . Cholecystectomy    . Shoulder surgery Bilateral   . Breast mass excision      Benign lump removal  . Carpal tunnel release Right 01/11/2013    Procedure: CARPAL TUNNEL RELEASE, RELEASE A-1 PULLEY RIGHT INDEX FINGER;  Surgeon: Cammie Sickle., MD;  Location: [facility];  Service: Orthopedics;  Laterality: Right;    FAMILY HISTORY Family History  Problem Relation Age of Onset  . Heart Problems Mother   . Hypertension Mother   . Diabetes Father   . Cancer Sister     Lung and Ovarian  . Diabetes Sister   . Heart Problems Brother   . Diabetes Brother   .  Diabetes Sister   . Diabetes Sister      GYNECOLOGIC HISTORY: G2, P2.  Menarche age 72.  Hysterectomy at age 40.  She used hormone therapy for a total of 20 years, she stopped hormone therapy in 2005.   SOCIAL HISTORY: Married for 57+ years, has two adult children.  She did office work until retirement.  Her husband worked for [employer].  They have five grandchildren.   ADVANCED DIRECTIVES: Not on file  HEALTH MAINTENANCE: History  Substance Use Topics  . Smoking status: Never Smoker   . Smokeless tobacco: Never Used  . Alcohol Use: No    Colonoscopy: PAP:  Bone density: Bone  density scan in 02/2012 showed a T score of -1.6 (osteopenia). Lipid panel: Not on file   Allergies  Allergen Reactions  . Amoxicillin     Current Outpatient Prescriptions  Medication Sig Dispense Refill  . calcium carbonate (OS-CAL) 600 MG TABS Take 600 mg by mouth daily with breakfast.       . cholecalciferol (VITAMIN D) 1000 UNITS tablet Take 1,000 Units by mouth daily.       Marland Kitchen ketoconazole (NIZORAL) 2 % cream Apply 1 application topically daily.       Marland Kitchen KLOR-CON M20 20 MEQ tablet Take 20 mEq by mouth daily.       Marland Kitchen letrozole (FEMARA) 2.5 MG tablet Take 1 tablet (2.5 mg total) by mouth daily.  90 tablet  4  . metroNIDAZOLE (METROCREAM) 0.75 % cream Apply 1 application topically daily.       . Multiple Vitamin (MULTIVITAMIN) tablet Take 1 tablet by mouth daily.      . Triamterene-HCTZ (MAXZIDE PO) Take 25 mg by mouth daily.       No current facility-administered medications for this visit.    OBJECTIVE: Middle-aged white woman in no acute distress  Filed Vitals:   08/29/13 1451  BP: 126/72  Pulse: 87  Temp: 98.4 F (36.9 C)  Resp: 18     Body mass index is 31.19 kg/(m^2).       ECOG FS: 0 - Asymptomatic   Sclerae unicteric, pupils equal and reactive Oropharynx clear and moist No cervical or supraclavicular adenopathy Lungs no rales or rhonchi Heart regular rate and rhythm Abd soft, nontender, positive bowel sounds MSK no focal spinal tenderness, no upper extremity lymphedema; tenderness to palpation and in the mid right sternal border consistent with costochondritis; no tenderness to vigorous palpation in the left rib cage on the left breast Neuro: nonfocal, well oriented, appropriate affect Breasts: The right breast is unremarkable. The left breast is status post lumpectomy and radiation. I do not palpate any evidence of disease recurrence. The left axilla is benign     LAB RESULTS: Lab Results  Component Value Date   WBC 5.7 08/29/2013   NEUTROABS 3.6 08/29/2013    HGB 13.0 08/29/2013   HCT 38.3 08/29/2013   MCV 89.1 08/29/2013   PLT 320 08/29/2013      Chemistry      Component Value Date/Time   NA 137 02/28/2013 1337   NA 132* 01/07/2013 1622   K 3.7 02/28/2013 1337   K 3.9 01/07/2013 1622   CL 94* 01/07/2013 1622   CL 99 08/09/2012 1546   CO2 27 02/28/2013 1337   CO2 27 01/07/2013 1622   BUN 20.2 02/28/2013 1337   BUN 20 01/07/2013 1622   CREATININE 0.9 02/28/2013 1337   CREATININE 0.93 01/07/2013 1622      Component Value Date/Time  CALCIUM 9.5 02/28/2013 1337   CALCIUM 9.9 01/07/2013 1622   ALKPHOS 92 02/28/2013 1337   ALKPHOS 69 12/30/2011 1448   AST 19 02/28/2013 1337   AST 22 12/30/2011 1448   ALT 23 02/28/2013 1337   ALT 27 12/30/2011 1448   BILITOT 0.35 02/28/2013 1337   BILITOT 0.2* 12/30/2011 1448       Lab Results  Component Value Date   LABCA2 22 01/02/2010    Urinalysis    Component Value Date/Time   COLORURINE YELLOW 05/28/2008 1310   APPEARANCEUR CLOUDY* 05/28/2008 1310   LABSPEC 1.010 05/28/2008 1310   PHURINE 6.0 05/28/2008 1310   GLUCOSEU NEGATIVE 05/28/2008 1310   HGBUR TRACE* 05/28/2008 1310   BILIRUBINUR NEGATIVE 05/28/2008 1310   KETONESUR NEGATIVE 05/28/2008 1310   PROTEINUR NEGATIVE 05/28/2008 1310   UROBILINOGEN 0.2 05/28/2008 1310   NITRITE NEGATIVE 05/28/2008 [place]* 05/28/2008 1310    STUDIES: CLINICAL DATA: Status post left lumpectomy and radiation therapy  for breast cancer in 2012.  EXAM:  DIGITAL DIAGNOSTIC BILATERAL MAMMOGRAM with CAD  COMPARISON: Previous examinations.  ACR Breast Density Category d: The breasts are extremely dense,  which lowers the sensitivity of mammography.  FINDINGS:  Stable post lumpectomy changes on the left. Progressive coarse  calcifications compatible with fat necrosis in an area of oil cyst  formation in the left breast in the region of the previous  lumpectomy. No new findings suspicious for malignancy in either  breast.  Mammographic images were  processed with CAD.  IMPRESSION:  No evidence of malignancy.  RECOMMENDATION:  Bilateral diagnostic mammogram in 1 year.  I have discussed the findings and recommendations with the patient.  Results were also provided in writing at the conclusion of the  visit. If applicable, a reminder letter will be sent to the patient  regarding the next appointment.  BI-RADS CATEGORY 2: Benign Finding(s)  Electronically Signed  By: Enrique Sack M.D.  On: 03/01/2013 15:15    ASSESSMENT: 78 y.o. [employer], [place] woman:  1. On 06/24/2010 the patient had a left breast lumpectomy with sentinel node biopsy for an mpT1c pN0, stage IA invasive ductal carcinoma, grade 2, ER 100%, PR 100%, Ki-67 63%, HER-2/neu negative. The patient had a reexcision of a positive superior margin (DCIS) on 07/11/2010 with no malignancy identifie  4. Status post radiation therapy from 08/21/2010-10/03/2010.  5. Continuation of anti-estrogen therapy with Femara (started neoadjuvantly 12/2009).  6. Abnormal mammogram on 02/26/2012 which showed a solitary linear calcification in a left lumpectomy site, likely representing a developing fat necrosis. Diagnostic left mammogram in 6 months with magnification views.  7. Chronic cyclical migraine headaches-- inactive  8. Osteopenia  PLAN: Lumen is doing fine as far as her breast cancer is concerned. The pain she is having in the right parasternal area I think is consistent with costochondritis. I could not elicit any tenderness in the left rib cage where she sometimes also hurts. We discussed the fact that stretching exercises and yoga will be helpful to her and of course she really does need to be on a walking program in addition to her activities of daily living  The plan is going to be to continue her Femara for total of 5 years. She will see [place] again November of this year. With her October mammogram we will set her up for a repeat bone density. She will then see me one  less time August of 2016 at which time she will "graduate".  Eladia is a good understanding of the overall plan. She agrees with it. She knows to call for any problems that may develop before next visit.  Chauncey Cruel, MD  08/29/2013, 2:55 PM

## 2013-08-31 ENCOUNTER — Telehealth: Payer: Self-pay | Admitting: Oncology

## 2013-08-31 NOTE — Telephone Encounter (Signed)
lmonvm for pt re appt for bone density @ [facility] 10/19. appt scheduled per 2nd 4/13 pof. appts for nov 2015 and aug 2016. already on schedule. new schedule mailed that includes bone density test.

## 2013-10-18 DIAGNOSIS — M722 Plantar fascial fibromatosis: Secondary | ICD-10-CM | POA: Diagnosis not present

## 2013-11-02 DIAGNOSIS — M6530 Trigger finger, unspecified finger: Secondary | ICD-10-CM | POA: Diagnosis not present

## 2013-11-02 DIAGNOSIS — M722 Plantar fascial fibromatosis: Secondary | ICD-10-CM | POA: Diagnosis not present

## 2013-11-24 DIAGNOSIS — N183 Chronic kidney disease, stage 3 unspecified: Secondary | ICD-10-CM | POA: Diagnosis not present

## 2013-11-24 DIAGNOSIS — Z23 Encounter for immunization: Secondary | ICD-10-CM | POA: Diagnosis not present

## 2013-11-24 DIAGNOSIS — I10 Essential (primary) hypertension: Secondary | ICD-10-CM | POA: Diagnosis not present

## 2014-01-05 DIAGNOSIS — H524 Presbyopia: Secondary | ICD-10-CM | POA: Diagnosis not present

## 2014-01-05 DIAGNOSIS — H2510 Age-related nuclear cataract, unspecified eye: Secondary | ICD-10-CM | POA: Diagnosis not present

## 2014-01-05 DIAGNOSIS — H43399 Other vitreous opacities, unspecified eye: Secondary | ICD-10-CM | POA: Diagnosis not present

## 2014-01-16 DIAGNOSIS — M7660 Achilles tendinitis, unspecified leg: Secondary | ICD-10-CM | POA: Diagnosis not present

## 2014-01-16 DIAGNOSIS — M722 Plantar fascial fibromatosis: Secondary | ICD-10-CM | POA: Diagnosis not present

## 2014-01-16 DIAGNOSIS — M76829 Posterior tibial tendinitis, unspecified leg: Secondary | ICD-10-CM | POA: Diagnosis not present

## 2014-01-19 DIAGNOSIS — L259 Unspecified contact dermatitis, unspecified cause: Secondary | ICD-10-CM | POA: Diagnosis not present

## 2014-01-19 DIAGNOSIS — L719 Rosacea, unspecified: Secondary | ICD-10-CM | POA: Diagnosis not present

## 2014-01-20 ENCOUNTER — Other Ambulatory Visit: Payer: Self-pay | Admitting: Oncology

## 2014-01-20 DIAGNOSIS — R921 Mammographic calcification found on diagnostic imaging of breast: Secondary | ICD-10-CM

## 2014-01-20 DIAGNOSIS — Z853 Personal history of malignant neoplasm of breast: Secondary | ICD-10-CM

## 2014-01-20 DIAGNOSIS — Z9889 Other specified postprocedural states: Secondary | ICD-10-CM

## 2014-02-21 DIAGNOSIS — M722 Plantar fascial fibromatosis: Secondary | ICD-10-CM | POA: Diagnosis not present

## 2014-03-06 ENCOUNTER — Other Ambulatory Visit: Payer: Medicare Other

## 2014-03-11 DIAGNOSIS — Z23 Encounter for immunization: Secondary | ICD-10-CM | POA: Diagnosis not present

## 2014-03-23 ENCOUNTER — Ambulatory Visit
Admission: RE | Admit: 2014-03-23 | Discharge: 2014-03-23 | Disposition: A | Discharge status: Home / Self Care | Payer: Medicare Other | Source: Ambulatory Visit | Attending: Oncology | Admitting: Oncology

## 2014-03-23 ENCOUNTER — Other Ambulatory Visit: Payer: Self-pay | Admitting: Oncology

## 2014-03-23 DIAGNOSIS — Z853 Personal history of malignant neoplasm of breast: Secondary | ICD-10-CM | POA: Diagnosis not present

## 2014-03-23 DIAGNOSIS — N63 Unspecified lump in breast: Secondary | ICD-10-CM | POA: Diagnosis not present

## 2014-03-23 DIAGNOSIS — R921 Mammographic calcification found on diagnostic imaging of breast: Secondary | ICD-10-CM

## 2014-03-23 DIAGNOSIS — C50919 Malignant neoplasm of unspecified site of unspecified female breast: Secondary | ICD-10-CM

## 2014-03-23 DIAGNOSIS — M8580 Other specified disorders of bone density and structure, unspecified site: Secondary | ICD-10-CM

## 2014-03-23 DIAGNOSIS — M85852 Other specified disorders of bone density and structure, left thigh: Secondary | ICD-10-CM | POA: Diagnosis not present

## 2014-03-23 DIAGNOSIS — M8588 Other specified disorders of bone density and structure, other site: Secondary | ICD-10-CM | POA: Diagnosis not present

## 2014-03-23 DIAGNOSIS — Z9889 Other specified postprocedural states: Secondary | ICD-10-CM

## 2014-03-23 DIAGNOSIS — Z780 Asymptomatic menopausal state: Secondary | ICD-10-CM | POA: Diagnosis not present

## 2014-03-25 ENCOUNTER — Other Ambulatory Visit: Payer: Self-pay | Admitting: Emergency Medicine

## 2014-03-25 DIAGNOSIS — C50919 Malignant neoplasm of unspecified site of unspecified female breast: Secondary | ICD-10-CM

## 2014-03-27 ENCOUNTER — Encounter: Payer: Self-pay | Admitting: Nurse Practitioner

## 2014-03-27 ENCOUNTER — Ambulatory Visit (HOSPITAL_BASED_OUTPATIENT_CLINIC_OR_DEPARTMENT_OTHER): Payer: Medicare Other | Admitting: Nurse Practitioner

## 2014-03-27 ENCOUNTER — Other Ambulatory Visit (HOSPITAL_BASED_OUTPATIENT_CLINIC_OR_DEPARTMENT_OTHER): Payer: Medicare Other

## 2014-03-27 VITALS — BP 149/80 | HR 76 | Temp 98.2°F | Resp 20 | Ht 64.0 in | Wt 186.5 lb

## 2014-03-27 DIAGNOSIS — C50919 Malignant neoplasm of unspecified site of unspecified female breast: Secondary | ICD-10-CM

## 2014-03-27 DIAGNOSIS — Z853 Personal history of malignant neoplasm of breast: Secondary | ICD-10-CM

## 2014-03-27 DIAGNOSIS — M8580 Other specified disorders of bone density and structure, unspecified site: Secondary | ICD-10-CM | POA: Insufficient documentation

## 2014-03-27 DIAGNOSIS — C50912 Malignant neoplasm of unspecified site of left female breast: Secondary | ICD-10-CM

## 2014-03-27 DIAGNOSIS — I10 Essential (primary) hypertension: Secondary | ICD-10-CM | POA: Diagnosis not present

## 2014-03-27 HISTORY — DX: Other specified disorders of bone density and structure, unspecified site: M85.80

## 2014-03-27 LAB — CBC WITH DIFFERENTIAL/PLATELET
BASO%: 0.6 % (ref 0.0–2.0)
Basophils Absolute: 0.1 10*3/uL (ref 0.0–0.1)
EOS%: 1.4 % (ref 0.0–7.0)
Eosinophils Absolute: 0.1 10*3/uL (ref 0.0–0.5)
HEMATOCRIT: 38 % (ref 34.8–46.6)
HEMOGLOBIN: 12.5 g/dL (ref 11.6–15.9)
LYMPH%: 29.9 % (ref 14.0–49.7)
MCH: 29.5 pg (ref 25.1–34.0)
MCHC: 32.9 g/dL (ref 31.5–36.0)
MCV: 89.6 fL (ref 79.5–101.0)
MONO#: 0.6 10*3/uL (ref 0.1–0.9)
MONO%: 7.5 % (ref 0.0–14.0)
NEUT#: 4.9 10*3/uL (ref 1.5–6.5)
NEUT%: 60.6 % (ref 38.4–76.8)
PLATELETS: 349 10*3/uL (ref 145–400)
RBC: 4.24 10*6/uL (ref 3.70–5.45)
RDW: 12.6 % (ref 11.2–14.5)
WBC: 8.1 10*3/uL (ref 3.9–10.3)
lymph#: 2.4 10*3/uL (ref 0.9–3.3)

## 2014-03-27 LAB — COMPREHENSIVE METABOLIC PANEL (CC13)
ALT: 25 U/L (ref 0–55)
ANION GAP: 8 meq/L (ref 3–11)
AST: 20 U/L (ref 5–34)
Albumin: 3.8 g/dL (ref 3.5–5.0)
Alkaline Phosphatase: 80 U/L (ref 40–150)
BILIRUBIN TOTAL: 0.3 mg/dL (ref 0.20–1.20)
BUN: 20.9 mg/dL (ref 7.0–26.0)
CO2: 31 mEq/L — ABNORMAL HIGH (ref 22–29)
Calcium: 10 mg/dL (ref 8.4–10.4)
Chloride: 98 mEq/L (ref 98–109)
Creatinine: 1 mg/dL (ref 0.6–1.1)
Glucose: 91 mg/dl (ref 70–140)
Potassium: 3.6 mEq/L (ref 3.5–5.1)
Sodium: 137 mEq/L (ref 136–145)
Total Protein: 6.7 g/dL (ref 6.4–8.3)

## 2014-03-27 NOTE — Progress Notes (Signed)
[place]  Telephone:(336) [phone] Fax:(336) [phone]  OFFICE PROGRESS NOTE    ID: [name]   DOB: 07-Jun-1935  MR#: [id number]  SEG#:[id number]   PCP: Horton Finer, MD SU: Rolm Bookbinder, MD RAD ONC: Rexene Edison, MD OTHER MD:  Youlanda Mighty. Sypher, MD  CHIEF COMPLAINT:  Left breast cancer CURRENT TREATMENT: letrozole daily  BREAST CANCER HISTORY: From Dr. Collier Salina Rubin's New Patient Evaluation Dated 01/02/2010:  "This is a delightful 78 year old woman here today with her husband and her daughter, [name], for evaluation and treatment of breast cancer.  This woman has been in reasonably good health.  She has undergone annual screening mammography.  A mammogram performed on 11/29/2009 showed a focal asymmetry at 3 o'clock position in the left breast.  Additional views are recommended.  On 12/04/2009, additional views with ultrasound of the breast were performed, which showed ultimately to be an area of heterogeneous echogenicity at 3 o'clock position.  No discrete mass was seen.  Biopsy was recommended.  A biopsy performed on 12/19/2009 showed invasive ductal cancer of intermediate grade.  No evidence of angiolymphatic invasion.  The tumor was ER/PR positive 100% respectively, proliferative index 63%.  No amplification by HER-2 by CISH.  The patient has had a preoperative MRI scan, which essentially showed this mass to be somewhat larger over the area of 12.9 x 2.5 x 2.2 cm.  No enlarged lymph nodes were seen."    Her subsequent history is as detailed below.   INTERVAL HISTORY: [name] returns today for follow up of her breast cancer. She has been on letrozole since August 2011 and is tolerating it well with no side effects that she is aware of. She denies hot flashes or vaginal changes. She has some joint pain, but believes she has arthritis and the drug has made this no worse. She had foot surgery this June and continues to have some residual pain, so she is not  walking on the treadmill as frequently as she would like.   REVIEW OF SYSTEMS: [name] denies fevers, chills, or changes in bowel or bladder habits. She has some mild nausea on rare occasions that is soothed by ginger ale. She has no headaches, dizziness, or unexplained weight loss. She denies shortness of breath, chest pain, cough, palpitations, or fatigue. A detailed review of systems is otherwise noncontributory.   PAST MEDICAL HISTORY: Past Medical History  Diagnosis Date  . Breast cancer   . Migraine   . Hypertension   . Menorrhagia   . Stress incontinence   . Gall stones   . Shortness of breath     once a year- gets checked by Dr.    Vennie Homans SURGICAL HISTORY: Past Surgical History  Procedure Laterality Date  . Abdominal hysterectomy      Ovaries retained  . Breast lumpectomy Left 01/02/10  . Bladder surgery      Bladder Tack  . Cholecystectomy    . Shoulder surgery Bilateral   . Breast mass excision      Benign lump removal  . Carpal tunnel release Right 01/11/2013    Procedure: CARPAL TUNNEL RELEASE, RELEASE A-1 PULLEY RIGHT INDEX FINGER;  Surgeon: Cammie Sickle., MD;  Location: [facility];  Service: Orthopedics;  Laterality: Right;    FAMILY HISTORY Family History  Problem Relation Age of Onset  . Heart Problems Mother   . Hypertension Mother   . Diabetes Father   . Cancer Sister     Lung and  Ovarian  . Diabetes Sister   . Heart Problems Brother   . Diabetes Brother   . Diabetes Sister   . Diabetes Sister      GYNECOLOGIC HISTORY: G2, P2.  Menarche age 53.  Hysterectomy at age 26.  She used hormone therapy for a total of 20 years, she stopped hormone therapy in 2005.   SOCIAL HISTORY: Married for 57+ years, has two adult children.  She did office work until retirement.  Her husband worked for [employer].  They have five grandchildren.   ADVANCED DIRECTIVES: Not on file  HEALTH MAINTENANCE: History  Substance Use Topics  . Smoking status:  Never Smoker   . Smokeless tobacco: Never Used  . Alcohol Use: No    Colonoscopy: PAP:  Bone density: Bone density scan in 02/2012 showed a T score of -1.6 (osteopenia). Lipid panel: Not on file   Allergies  Allergen Reactions  . Amoxicillin     Current Outpatient Prescriptions  Medication Sig Dispense Refill  . calcium carbonate (OS-CAL) 600 MG TABS Take 600 mg by mouth daily with breakfast.     . cholecalciferol (VITAMIN D) 1000 UNITS tablet Take 1,000 Units by mouth daily.     Marland Kitchen KLOR-CON M20 20 MEQ tablet Take 20 mEq by mouth daily.     Marland Kitchen letrozole (FEMARA) 2.5 MG tablet Take 1 tablet (2.5 mg total) by mouth daily. 90 tablet 4  . metroNIDAZOLE (METROCREAM) 0.75 % cream Apply 1 application topically daily.     . Multiple Vitamin (MULTIVITAMIN) tablet Take 1 tablet by mouth daily.    . Triamterene-HCTZ (MAXZIDE PO) Take 25 mg by mouth daily.    Marland Kitchen FLUZONE HIGH-DOSE 0.5 ML SUSY   0  . meloxicam (MOBIC) 15 MG tablet      No current facility-administered medications for this visit.    OBJECTIVE: Middle-aged white woman in no acute distress  Filed Vitals:   03/27/14 1420  BP: 149/80  Pulse: 76  Temp: 98.2 F (36.8 C)  Resp: 20     Body mass index is 32 kg/(m^2).       ECOG FS: 0 - Asymptomatic   Skin: warm, dry  HEENT: sclerae anicteric, conjunctivae pink, oropharynx clear. No thrush or mucositis.  Lymph Nodes: No cervical or supraclavicular lymphadenopathy  Lungs: clear to auscultation bilaterally, no rales, wheezes, or rhonci  Heart: regular rate and rhythm  Abdomen: round, soft, non tender, positive bowel sounds  Musculoskeletal: No focal spinal tenderness, no peripheral edema  Neuro: non focal, well oriented, positive affect  Breasts: left breast status post lumpectomy and radiation. No evidence of recurrent disease. Left axilla benign. Right breast unremarkable.   LAB RESULTS: Lab Results  Component Value Date   WBC 8.1 03/27/2014   NEUTROABS 4.9 03/27/2014    HGB 12.5 03/27/2014   HCT 38.0 03/27/2014   MCV 89.6 03/27/2014   PLT 349 03/27/2014      Chemistry      Component Value Date/Time   NA 137 03/27/2014 1402   NA 132* 01/07/2013 1622   K 3.6 03/27/2014 1402   K 3.9 01/07/2013 1622   CL 94* 01/07/2013 1622   CL 99 08/09/2012 1546   CO2 31* 03/27/2014 1402   CO2 27 01/07/2013 1622   BUN 20.9 03/27/2014 1402   BUN 20 01/07/2013 1622   CREATININE 1.0 03/27/2014 1402   CREATININE 0.93 01/07/2013 1622      Component Value Date/Time   CALCIUM 10.0 03/27/2014 1402   CALCIUM 9.9  01/07/2013 1622   ALKPHOS 80 03/27/2014 1402   ALKPHOS 69 12/30/2011 1448   AST 20 03/27/2014 1402   AST 22 12/30/2011 1448   ALT 25 03/27/2014 1402   ALT 27 12/30/2011 1448   BILITOT 0.30 03/27/2014 1402   BILITOT 0.2* 12/30/2011 1448       Lab Results  Component Value Date   LABCA2 22 01/02/2010    Urinalysis    Component Value Date/Time   COLORURINE YELLOW 05/28/2008 1310   APPEARANCEUR CLOUDY* 05/28/2008 1310   LABSPEC 1.010 05/28/2008 1310   PHURINE 6.0 05/28/2008 1310   GLUCOSEU NEGATIVE 05/28/2008 1310   HGBUR TRACE* 05/28/2008 1310   BILIRUBINUR NEGATIVE 05/28/2008 1310   KETONESUR NEGATIVE 05/28/2008 1310   PROTEINUR NEGATIVE 05/28/2008 1310   UROBILINOGEN 0.2 05/28/2008 1310   NITRITE NEGATIVE 05/28/2008 1310   LEUKOCYTESUR LARGE* 05/28/2008 1310    STUDIES: Most recent mammogram on 03/23/14 required a follow up ultrasound, but ended up being benign.  Most recent bone density scan on 03/24/14 showed a t-score of -1.6 (osteopenia)  ASSESSMENT: 78 y.o. [employer], [place] woman:  1. On 06/24/2010 the patient had a left breast lumpectomy with sentinel node biopsy for an mpT1c pN0, stage IA invasive ductal carcinoma, grade 2, ER 100%, PR 100%, Ki-67 63%, HER-2/neu negative. The patient had a reexcision of a positive superior margin (DCIS) on 07/11/2010 with no malignancy identifie  4. Status post radiation therapy  from 08/21/2010-10/03/2010.  5. Continuation of anti-estrogen therapy with Femara (started neoadjuvantly 12/2009).  6. Abnormal mammogram on 02/26/2012 which showed a solitary linear calcification in a left lumpectomy site, likely representing a developing fat necrosis. Diagnostic left mammogram in 6 months with magnification views.  7. Chronic cyclical migraine headaches-- inactive  8. Osteopenia  PLAN: From a breast cancer point of view [name] is doing very well. The labs were reviewed in detail and were entirely stable. She is now almost 4 years out from her definitive surgery with no evidence of recurrent disease. She is tolerating the letrozole well and the plan is to continue this drug until August 2016 to complete 5 years of anti-estrogen therapy. Her most recent bone density scan shows stable osteopenia and she continues on her calcium and vitamin D supplements.   Gracianna will return next August for labs and an office visit. At this time she will be eligible to "graduate" from follow up visits. She understands and agrees with this plan. She knows the goal of treatment in her case is cure. She has been encouraged to call with any issues that might arise before her next visit here.   Kaitlyn Duster, NP  03/27/2014, 2:49 PM

## 2014-03-28 ENCOUNTER — Telehealth: Payer: Self-pay | Admitting: Oncology

## 2014-03-30 DIAGNOSIS — B9689 Other specified bacterial agents as the cause of diseases classified elsewhere: Secondary | ICD-10-CM | POA: Diagnosis not present

## 2014-03-30 DIAGNOSIS — L02229 Furuncle of trunk, unspecified: Secondary | ICD-10-CM | POA: Diagnosis not present

## 2014-04-30 ENCOUNTER — Telehealth: Payer: Self-pay | Admitting: Oncology

## 2014-04-30 NOTE — Telephone Encounter (Signed)
s.w. pt and advosed on 8.8.16 appt changed to 8.15 due to MD on pal..Marland KitchenMarland KitchenMarland Kitchenpt ok and aware of new d.t

## 2014-05-09 ENCOUNTER — Encounter: Payer: Self-pay | Admitting: Oncology

## 2014-05-25 DIAGNOSIS — M65342 Trigger finger, left ring finger: Secondary | ICD-10-CM | POA: Diagnosis not present

## 2014-05-25 DIAGNOSIS — M79672 Pain in left foot: Secondary | ICD-10-CM | POA: Diagnosis not present

## 2014-06-05 DIAGNOSIS — Z1389 Encounter for screening for other disorder: Secondary | ICD-10-CM | POA: Diagnosis not present

## 2014-06-05 DIAGNOSIS — I129 Hypertensive chronic kidney disease with stage 1 through stage 4 chronic kidney disease, or unspecified chronic kidney disease: Secondary | ICD-10-CM | POA: Diagnosis not present

## 2014-06-05 DIAGNOSIS — Z0000 Encounter for general adult medical examination without abnormal findings: Secondary | ICD-10-CM | POA: Diagnosis not present

## 2014-06-05 DIAGNOSIS — N183 Chronic kidney disease, stage 3 (moderate): Secondary | ICD-10-CM | POA: Diagnosis not present

## 2014-09-21 DIAGNOSIS — M722 Plantar fascial fibromatosis: Secondary | ICD-10-CM | POA: Diagnosis not present

## 2014-11-27 ENCOUNTER — Other Ambulatory Visit: Payer: Self-pay | Admitting: Oncology

## 2014-11-27 NOTE — Telephone Encounter (Signed)
Last ov 03/27/14.  Next ov 01/01/15.  Chart reviewed.

## 2014-12-05 DIAGNOSIS — R079 Chest pain, unspecified: Secondary | ICD-10-CM | POA: Diagnosis not present

## 2014-12-05 DIAGNOSIS — I10 Essential (primary) hypertension: Secondary | ICD-10-CM | POA: Diagnosis not present

## 2014-12-25 ENCOUNTER — Ambulatory Visit: Payer: Medicare Other | Admitting: Oncology

## 2014-12-25 ENCOUNTER — Other Ambulatory Visit: Payer: Medicare Other

## 2015-01-01 ENCOUNTER — Ambulatory Visit (HOSPITAL_BASED_OUTPATIENT_CLINIC_OR_DEPARTMENT_OTHER): Payer: Medicare Other | Admitting: Oncology

## 2015-01-01 ENCOUNTER — Encounter: Payer: Self-pay | Admitting: Oncology

## 2015-01-01 ENCOUNTER — Other Ambulatory Visit (HOSPITAL_BASED_OUTPATIENT_CLINIC_OR_DEPARTMENT_OTHER): Payer: Medicare Other

## 2015-01-01 VITALS — BP 156/68 | HR 80 | Temp 98.7°F | Resp 18 | Ht 64.0 in | Wt 180.6 lb

## 2015-01-01 DIAGNOSIS — M8580 Other specified disorders of bone density and structure, unspecified site: Secondary | ICD-10-CM | POA: Diagnosis not present

## 2015-01-01 DIAGNOSIS — C50919 Malignant neoplasm of unspecified site of unspecified female breast: Secondary | ICD-10-CM

## 2015-01-01 DIAGNOSIS — Z170 Estrogen receptor positive status [ER+]: Secondary | ICD-10-CM | POA: Diagnosis not present

## 2015-01-01 DIAGNOSIS — C50912 Malignant neoplasm of unspecified site of left female breast: Secondary | ICD-10-CM

## 2015-01-01 DIAGNOSIS — C50812 Malignant neoplasm of overlapping sites of left female breast: Secondary | ICD-10-CM | POA: Diagnosis not present

## 2015-01-01 LAB — CBC WITH DIFFERENTIAL/PLATELET
BASO%: 0.8 % (ref 0.0–2.0)
Basophils Absolute: 0 10*3/uL (ref 0.0–0.1)
EOS ABS: 0.1 10*3/uL (ref 0.0–0.5)
EOS%: 1.8 % (ref 0.0–7.0)
HCT: 39.5 % (ref 34.8–46.6)
HGB: 13 g/dL (ref 11.6–15.9)
LYMPH#: 1.6 10*3/uL (ref 0.9–3.3)
LYMPH%: 29.5 % (ref 14.0–49.7)
MCH: 29.5 pg (ref 25.1–34.0)
MCHC: 33 g/dL (ref 31.5–36.0)
MCV: 89.2 fL (ref 79.5–101.0)
MONO#: 0.5 10*3/uL (ref 0.1–0.9)
MONO%: 10.3 % (ref 0.0–14.0)
NEUT%: 57.6 % (ref 38.4–76.8)
NEUTROS ABS: 3.1 10*3/uL (ref 1.5–6.5)
PLATELETS: 301 10*3/uL (ref 145–400)
RBC: 4.42 10*6/uL (ref 3.70–5.45)
RDW: 13.5 % (ref 11.2–14.5)
WBC: 5.3 10*3/uL (ref 3.9–10.3)

## 2015-01-01 LAB — COMPREHENSIVE METABOLIC PANEL (CC13)
ALBUMIN: 3.8 g/dL (ref 3.5–5.0)
ALT: 26 U/L (ref 0–55)
AST: 20 U/L (ref 5–34)
Alkaline Phosphatase: 83 U/L (ref 40–150)
Anion Gap: 12 mEq/L — ABNORMAL HIGH (ref 3–11)
BILIRUBIN TOTAL: 0.26 mg/dL (ref 0.20–1.20)
BUN: 17.2 mg/dL (ref 7.0–26.0)
CO2: 25 meq/L (ref 22–29)
Calcium: 9.7 mg/dL (ref 8.4–10.4)
Chloride: 101 mEq/L (ref 98–109)
Creatinine: 1 mg/dL (ref 0.6–1.1)
EGFR: 52 mL/min/{1.73_m2} — AB (ref >90)
Glucose: 127 mg/dl (ref 70–140)
Potassium: 3.6 mEq/L (ref 3.5–5.1)
Sodium: 138 mEq/L (ref 136–145)
Total Protein: 6.5 g/dL (ref 6.4–8.3)

## 2015-01-01 NOTE — Progress Notes (Signed)
[name]  [phone] [phone]  OFFICE PROGRESS NOTE    ID: [name]   DOB: 08-23-1935  MR#: [id number]  ACZ#:[id number]   PCP: Kaitlyn Shelling, MD SU: Kaitlyn Bookbinder, MD RAD ONC: Kaitlyn Edison, MD OTHER MD:   CHIEF COMPLAINT:  Left breast cancer CURRENT TREATMENT: letrozole daily  BREAST CANCER HISTORY: From Dr. Collier Salina Good's New Patient Evaluation Dated 01/02/2010:  "This is a delightful 79 year old woman here today with her husband and her daughter, [name], for evaluation and treatment of breast cancer.  This woman has been in reasonably good health.  She has undergone annual screening mammography.  A mammogram performed on 11/29/2009 showed a focal asymmetry at 3 o'clock position in the left breast.  Additional views are recommended.  On 12/04/2009, additional views with ultrasound of the breast were performed, which showed ultimately to be an area of heterogeneous echogenicity at 3 o'clock position.  No discrete mass was seen.  Biopsy was recommended.  A biopsy performed on 12/19/2009 showed invasive ductal cancer of intermediate grade.  No evidence of angiolymphatic invasion.  The tumor was ER/PR positive 100% respectively, proliferative index 63%.  No amplification by HER-2 by [facility].  The patient has had a preoperative MRI scan, which essentially showed this mass to be somewhat larger over the area of 12.9 x 2.5 x 2.2 cm.  No enlarged lymph nodes were seen."    Her subsequent history is as detailed below.   INTERVAL HISTORY: [name] returns today for follow up of her breast canceraccompanied by her husband. She continues on letrozole. She tolerates this well. Hot flashes and vaginal dryness are not significant concerns. She doesn't think the aches and pains she has occasionally are related to the letrozole. She obtains it at a good price.  REVIEW OF SYSTEMS: [name] has migraines rarely but when the hip they are very severe. Dr.  Maxwell Good has helped her with this. Otherwise have been no visual changes, nausea, vomiting, cough, phlegm production, pleurisy, or change in bowel or bladder habits. A detailed review of systems today was stable  PAST MEDICAL HISTORY: Past Medical History  Diagnosis Date  . Breast cancer   . Migraine   . Hypertension   . Menorrhagia   . Stress incontinence   . Gall stones   . Shortness of breath     once a year- gets checked by Dr.    Vennie Good SURGICAL HISTORY: Past Surgical History  Procedure Laterality Date  . Abdominal hysterectomy      Ovaries retained  . Breast lumpectomy Left 01/02/10  . Bladder surgery      Bladder Tack  . Cholecystectomy    . Shoulder surgery Bilateral   . Breast mass excision      Benign lump removal  . Carpal tunnel release Right 01/11/2013    Procedure: CARPAL TUNNEL RELEASE, RELEASE A-1 PULLEY RIGHT INDEX FINGER;  Surgeon: [name]., MD;  Location: [place];  Service: Orthopedics;  Laterality: Right;    FAMILY HISTORY Family History  Problem Relation Age of Onset  . Heart Problems Mother   . Hypertension Mother   . Diabetes Father   . Cancer Sister     Lung and Ovarian  . Diabetes Sister   . Heart Problems Brother   . Diabetes Brother   . Diabetes Sister   . Diabetes Sister      GYNECOLOGIC HISTORY: G2, P2.  Menarche age 56.  Hysterectomy at age 55.  She used hormone therapy for a total of 20 years, she stopped hormone therapy in 2005.   SOCIAL HISTORY: Married for 57+ years, has two adult children.  She did office work until retirement.  Her husband worked for [employer].  They have five grandchildren.   ADVANCED DIRECTIVES: Not on file  HEALTH MAINTENANCE: Social History  Substance Use Topics  . Smoking status: Never Smoker   . Smokeless tobacco: Never Used  . Alcohol Use: No    Colonoscopy: PAP:  Bone density: Bone density scan in 02/2012 showed a T score of -1.6 (osteopenia). Lipid panel: Not on  file   Allergies  Allergen Reactions  . Amoxicillin     Current Outpatient Prescriptions  Medication Sig Dispense Refill  . calcium carbonate (OS-CAL) 600 MG TABS Take 600 mg by mouth daily with breakfast.     . cholecalciferol (VITAMIN D) 1000 UNITS tablet Take 1,000 Units by mouth daily.     Marland Kitchen FLUZONE HIGH-DOSE 0.5 ML SUSY   0  . KLOR-CON M20 20 MEQ tablet Take 20 mEq by mouth daily.     Marland Kitchen letrozole (FEMARA) 2.5 MG tablet TAKE 1 TABLET DAILY 30 tablet 1  . meloxicam (MOBIC) 15 MG tablet     . metroNIDAZOLE (METROCREAM) 0.75 % cream Apply 1 application topically daily.     . Multiple Vitamin (MULTIVITAMIN) tablet Take 1 tablet by mouth daily.    . Triamterene-HCTZ (MAXZIDE PO) Take 25 mg by mouth daily.     No current facility-administered medications for this visit.    OBJECTIVE: Middle-aged white woman who appears well Filed Vitals:   01/01/15 1406  BP: 156/68  Pulse: 80  Temp: 98.7 F (37.1 C)  Resp: 18     Body mass index is 30.98 kg/(m^2).       ECOG FS: 0 - Asymptomatic   Sclerae unicteric, pupils round and equal Oropharynx clear and moist-- no thrush or other lesions No cervical or supraclavicular adenopathy Lungs no rales or rhonchi Heart regular rate and rhythm Abd soft, nontender, positive bowel sounds MSK no focal spinal tenderness, no upper extremity lymphedema Neuro: nonfocal, well oriented, appropriate affect Breasts: the right breast is unremarkable. The left breast is status post lumpectomy and radiation. There is no evidence of local recurrence. The left axilla is benign.   LAB RESULTS: Lab Results  Component Value Date   WBC 5.3 01/01/2015   NEUTROABS 3.1 01/01/2015   HGB 13.0 01/01/2015   HCT 39.5 01/01/2015   MCV 89.2 01/01/2015   PLT 301 01/01/2015      Chemistry      Component Value Date/Time   NA 137 03/27/2014 1402   NA 132* 01/07/2013 1622   K 3.6 03/27/2014 1402   K 3.9 01/07/2013 1622   CL 94* 01/07/2013 1622   CL 99  08/09/2012 1546   CO2 31* 03/27/2014 1402   CO2 27 01/07/2013 1622   BUN 20.9 03/27/2014 1402   BUN 20 01/07/2013 1622   CREATININE 1.0 03/27/2014 1402   CREATININE 0.93 01/07/2013 1622      Component Value Date/Time   CALCIUM 10.0 03/27/2014 1402   CALCIUM 9.9 01/07/2013 1622   ALKPHOS 80 03/27/2014 1402   ALKPHOS 69 12/30/2011 1448   AST 20 03/27/2014 1402   AST 22 12/30/2011 1448   ALT 25 03/27/2014 1402   ALT 27 12/30/2011 1448   BILITOT 0.30 03/27/2014 1402   BILITOT 0.2* 12/30/2011 1448       Lab Results  Component Value Date   LABCA2 22 01/02/2010    Urinalysis    Component Value Date/Time   COLORURINE YELLOW 05/28/2008 1310   APPEARANCEUR CLOUDY* 05/28/2008 1310   LABSPEC 1.010 05/28/2008 1310   PHURINE 6.0 05/28/2008 1310   GLUCOSEU NEGATIVE 05/28/2008 1310   HGBUR TRACE* 05/28/2008 1310   BILIRUBINUR NEGATIVE 05/28/2008 1310   KETONESUR NEGATIVE 05/28/2008 1310   PROTEINUR NEGATIVE 05/28/2008 1310   UROBILINOGEN 0.2 05/28/2008 1310   NITRITE NEGATIVE 05/28/2008 1310   LEUKOCYTESUR LARGE* 05/28/2008 1310    STUDIES: No results found.  ASSESSMENT: 79 y.o. [employer], [place] woman:  1. On 06/24/2010 the patient had a left breast lumpectomy with sentinel node biopsy for an mpT1c pN0, stage IA invasive ductal carcinoma, grade 2, ER 100%, PR 100%, Ki-67 63%, HER-2/neu negative. The patient had a reexcision of a positive superior margin (DCIS) on 07/11/2010 with no malignancy identifie  4. Status post radiation therapy from 08/21/2010-10/03/2010.  5. Continuation of anti-estrogen therapy with Femara (started neoadjuvantly 12/2009).  6. Abnormal mammogram on 02/26/2012 which showed a solitary linear calcification in a left lumpectomy site, likely representing a developing fat necrosis.   7. Chronic cyclical migraine headaches-- controlled  8. Osteopenia: bone density scan on 03/24/14 showed a t-score of -1.6  PLAN: Aarionna has completed 5 years  of antiestrogen therapy. Her overall prognosis is very good.  We discussed the recent data that shows an additional 5 years of letrozole would further reduce the risk of recurrence by about 3%. The reduction in the risk of outside the breast recurrence would only be 1%.  These numbers were not motivating to her and she prefers to discontinue the letrozole. I am comfortable with that decision.  Normally I would discharge her from follow-up here, but she would like to participate in our new survivorship program so I'm making her an appointment with our survivorship nurse practitioner for February. She will have her next mammogram in November. I anticipate she will want to see the nurse practitioner on a yearly basis at least for the next 5 years.  There is as a good understanding of this plan. She agrees with it. She knows the goal of treatment in her case is cure. She will call with any problems that may develop before her next visit.  Chauncey Cruel, MD  01/01/2015, 2:33 PM

## 2015-02-27 ENCOUNTER — Other Ambulatory Visit: Payer: Self-pay

## 2015-02-27 ENCOUNTER — Other Ambulatory Visit: Payer: Self-pay | Admitting: Oncology

## 2015-02-27 DIAGNOSIS — Z853 Personal history of malignant neoplasm of breast: Secondary | ICD-10-CM

## 2015-03-26 ENCOUNTER — Ambulatory Visit
Admission: RE | Admit: 2015-03-26 | Discharge: 2015-03-26 | Disposition: A | Discharge status: Home / Self Care | Payer: Medicare Other | Source: Ambulatory Visit | Attending: Oncology | Admitting: Oncology

## 2015-03-26 DIAGNOSIS — R928 Other abnormal and inconclusive findings on diagnostic imaging of breast: Secondary | ICD-10-CM | POA: Diagnosis not present

## 2015-03-26 DIAGNOSIS — Z853 Personal history of malignant neoplasm of breast: Secondary | ICD-10-CM

## 2015-04-04 DIAGNOSIS — Z23 Encounter for immunization: Secondary | ICD-10-CM | POA: Diagnosis not present

## 2015-04-26 DIAGNOSIS — Z1283 Encounter for screening for malignant neoplasm of skin: Secondary | ICD-10-CM | POA: Diagnosis not present

## 2015-04-26 DIAGNOSIS — R208 Other disturbances of skin sensation: Secondary | ICD-10-CM | POA: Diagnosis not present

## 2015-04-26 DIAGNOSIS — L718 Other rosacea: Secondary | ICD-10-CM | POA: Diagnosis not present

## 2015-06-07 DIAGNOSIS — Z1389 Encounter for screening for other disorder: Secondary | ICD-10-CM | POA: Diagnosis not present

## 2015-06-07 DIAGNOSIS — N183 Chronic kidney disease, stage 3 (moderate): Secondary | ICD-10-CM | POA: Diagnosis not present

## 2015-06-07 DIAGNOSIS — Z0000 Encounter for general adult medical examination without abnormal findings: Secondary | ICD-10-CM | POA: Diagnosis not present

## 2015-06-07 DIAGNOSIS — I129 Hypertensive chronic kidney disease with stage 1 through stage 4 chronic kidney disease, or unspecified chronic kidney disease: Secondary | ICD-10-CM | POA: Diagnosis not present

## 2015-07-27 ENCOUNTER — Other Ambulatory Visit: Payer: Self-pay | Admitting: Internal Medicine

## 2015-07-27 ENCOUNTER — Ambulatory Visit
Admission: RE | Admit: 2015-07-27 | Discharge: 2015-07-27 | Disposition: A | Discharge status: Home / Self Care | Payer: Medicare Other | Source: Ambulatory Visit | Attending: Internal Medicine | Admitting: Internal Medicine

## 2015-07-27 DIAGNOSIS — R109 Unspecified abdominal pain: Secondary | ICD-10-CM | POA: Diagnosis not present

## 2015-07-27 DIAGNOSIS — K5900 Constipation, unspecified: Secondary | ICD-10-CM | POA: Diagnosis not present

## 2015-08-06 ENCOUNTER — Telehealth: Payer: Self-pay | Admitting: Nurse Practitioner

## 2015-08-06 ENCOUNTER — Ambulatory Visit (HOSPITAL_BASED_OUTPATIENT_CLINIC_OR_DEPARTMENT_OTHER): Payer: Medicare Other | Admitting: Nurse Practitioner

## 2015-08-06 ENCOUNTER — Encounter: Payer: Self-pay | Admitting: Nurse Practitioner

## 2015-08-06 VITALS — BP 148/75 | HR 76 | Temp 98.0°F | Resp 18 | Ht 64.0 in | Wt 180.1 lb

## 2015-08-06 DIAGNOSIS — C50912 Malignant neoplasm of unspecified site of left female breast: Secondary | ICD-10-CM

## 2015-08-06 DIAGNOSIS — M8580 Other specified disorders of bone density and structure, unspecified site: Secondary | ICD-10-CM | POA: Diagnosis not present

## 2015-08-06 DIAGNOSIS — Z780 Asymptomatic menopausal state: Secondary | ICD-10-CM

## 2015-08-06 DIAGNOSIS — Z853 Personal history of malignant neoplasm of breast: Secondary | ICD-10-CM

## 2015-08-06 NOTE — Telephone Encounter (Signed)
per pof to sch pt appt-sent [place] email to out mamma order in so I could sch BOTH DEXA & mamma-awaiting reply-*will call pt after reply

## 2015-08-06 NOTE — Patient Instructions (Signed)
Thank you for coming in today!  As we discussed, please continue to perform your self breast exam and report any changes. If you note any new symptoms (please see below), be sure to notify us ASAP.  Your mammogram will be due in November 2017 and we will enter orders for it today.  We'll have you return in one year's time to see me for your next appointment or sooner if you have any problems. Please be sure to stop by scheduling on your way out to make those appointment(s).  Looking forward to working with you in the future!  Let us know if you have any questions!  Symptoms to Watch for and Report to Your Provider  . Return of the cancer symptoms you had before- such as a lump or new growth where your cancer first started . New or unusual pain that seems unrelated to an injury and does not go away, including back pain or bone pain . Weight loss without trying/intending . Unexplained bleeding . A rash or allergic reaction, such as swelling, severe itching or wheezing . Chills or fevers . Persistent headaches . Shortness of breath or difficulty breathing . Bloody stools or blood in your urine . Lumps, bumps, swelling and/or nipple discharge . Nausea, vomiting, diarrhea, loss of appetite, or trouble swallowing . A cough that doesn't go away . Abdominal pain . Swelling in your arms or legs . Fractures . Hot flashes or other menopausal symptoms . Any other signs mentioned by your doctor or nurse or any unusual symptoms                 that you just can't explain   NOTE: Just because you have certain symptoms, it doesn't mean the cancer has come back or you have a new cancer. Symptoms can be due to other problems that need to be addressed.  It is important to watch for these symptoms and report them to your provider so you can be medically evaluated for any of these concerns!    Living a Life of Wellness After Cancer:  *Note: Please consult your health care provider before using any medications,  supplements, over-the-counter products, or other interventions.  Also, please consult your primary care provider before you begin any lifestyle program (diet, exercise, etc.).  Your safety is our top priority and we want to make sure you continue to live a long and healthy life!    Healthy Lifestyle Recommendations  As a cancer survivor, it is important develop a lifelong commitment to a healthy lifestyle. A healthy lifestyle can prevent cancer from returning as well as prevent other diseases like heart disease, diabetes and high blood pressure.  These are some things that you can do to have a healthy lifestyle:  Marland Kitchen Maintain a healthy weight.  . Exercise daily per your doctor's orders. . Eat a balanced diet high in fruits, vegetables, bran, and fiber. Limit intake of red meat      and processed foods.  . Limit how much alcohol you consume, if at all. Ali Lowe regular bone mineral density testing for osteoporosis.  . Talk to your doctor about cardiovascular disease or "heart disease" screening. . Stop smoking (if you smoke). . Know your family history. . Be mindful of your emotional, social, and spiritual needs. . Meet regularly with a Primary Care Provider (PCP). Find a PCP if you do not             already have one. . Talk to  your doctor about regular cancer screening including screening for colon           cancer, GYN cancers, and skin cancer.

## 2015-08-06 NOTE — Progress Notes (Signed)
CLINIC:  Cancer Survivorship   REASON FOR VISIT:  Routine follow-up post-treatment for history of breast cancer.  BRIEF ONCOLOGIC HISTORY:    Breast cancer, left breast ([place])   12/2009 - 12/2014 Neo-Adjuvant Anti-estrogen oral therapy Letrozole 2.5 mg   06/24/2010 Definitive Surgery Left lumpectomy/SLNB: IDC, grade 2, ER+ (100%), PR+ (100%), HER2/neu negative, Ki67 63%.   06/24/2010 Pathologic Stage Stage IA: mpT1c pN0   07/11/2010 Surgery Rexcision of margin   08/21/2010 - 10/03/2010 Radiation Therapy Adjuvant RT to left breast   02/26/2012 Mammogram Left breast: solitary linear calcification likely representing a developing fat necrosis    INTERVAL HISTORY:  [name] presents to the [facility] today for ongoing follow up regarding her history of breast cancer. Overall, [name] reports doing well since her last visit. She does report a recent three day history of stomach pain that developed towards the end of last week. She was seen at her PCP's office who was concerned that she had a small bowel obstruction upon presentation (based on [name]'s account).  She underwent xray, which was negative, and was treated for constipation with miralax.  She reports that the symptoms have resolved and she is back to her normal schedule of 3-4 bowel movements daily.  She has not noticed any change within her breast and her last mammogram was in November 2016 and unremarkable.  She does have intermittent fatigue, but this is no worse. She denies any headache, cough, shortness of breath, or bone pain. She reports a good appetite and denies any weight loss.    REVIEW OF SYSTEMS:  General: Mild fatigue on occasion.  Denies fever, chills, unintentional weight loss, or night sweats.  HEENT: History of migraines, stable. Wears contacts.  Sinus problems.  Occasional nose bleeds if she takes ibuprofen or acetaminophen. Wears dentures. Denies visual changes, hearing loss, mouth sores, or difficulty  swallowing. Cardiac: Denies palpitations and lower extremity edema.  Respiratory: Denies wheeze or dyspnea on exertion.  Breast: Denies any new nodularity, masses, tenderness, nipple changes, or nipple discharge.  GI: Resolving constipation, as above.  Otherwise, denies abdominal pain, diarrhea, nausea, or vomiting.  GU: Denies dysuria, hematuria, vaginal bleeding, vaginal discharge, or vaginal dryness.  Musculoskeletal:Joint aches unchanged since discontinuation of letrozole. Neuro: Denies recent fall or numbness / tingling in her extremities.  Skin: Bruises easily. Denies rash, pruritis, or open wounds.  Psych: Denies depression, anxiety, insomnia, or memory loss.   A 14-point review of systems was completed and was negative, except as noted above.   ONCOLOGY TREATMENT TEAM:  1. Surgeon:  Dr. Donne Hazel at [facility]  2. Medical Oncologist: Dr. Jana Hakim 3. Radiation Oncologist: Dr. Valere Dross    PAST MEDICAL/SURGICAL HISTORY:  Past Medical History  Diagnosis Date  . Breast cancer ([place])   . Migraine   . Hypertension   . Menorrhagia   . Stress incontinence   . Gall stones   . Shortness of breath     once a year- gets checked by Dr.   Past Surgical History  Procedure Laterality Date  . Abdominal hysterectomy      Ovaries retained  . Breast lumpectomy Left 01/02/10  . Bladder surgery      Bladder Tack  . Cholecystectomy    . Shoulder surgery Bilateral   . Breast mass excision      Benign lump removal  . Carpal tunnel release Right 01/11/2013    Procedure: CARPAL TUNNEL RELEASE, RELEASE A-1 PULLEY RIGHT INDEX FINGER;  Surgeon: Herbie Baltimore  Christena Flake., MD;  Location: [place];  Service: Orthopedics;  Laterality: Right;     ALLERGIES:  Allergies  Allergen Reactions  . Amoxicillin      CURRENT MEDICATIONS:  Current Outpatient Prescriptions on File Prior to Visit  Medication Sig Dispense Refill  . calcium carbonate (OS-CAL) 600 MG TABS Take 600  mg by mouth daily with breakfast.     . cholecalciferol (VITAMIN D) 1000 UNITS tablet Take 1,000 Units by mouth daily.     Marland Kitchen KLOR-CON M20 20 MEQ tablet Take 20 mEq by mouth daily.     . metroNIDAZOLE (METROCREAM) 0.75 % cream Apply 1 application topically daily.     . Multiple Vitamin (MULTIVITAMIN) tablet Take 1 tablet by mouth daily.    . Triamterene-HCTZ (MAXZIDE PO) Take 25 mg by mouth daily.     No current facility-administered medications on file prior to visit.     ONCOLOGIC FAMILY HISTORY:  Family History  Problem Relation Age of Onset  . Heart Problems Mother   . Hypertension Mother   . Diabetes Father   . Cancer Sister     Lung and Ovarian  . Diabetes Sister   . Heart Problems Brother   . Diabetes Brother   . Diabetes Sister   . Diabetes Sister      GENETIC COUNSELING/TESTING: No   SOCIAL HISTORY:  [name] is married and lives with her spouse in [place], [place].  She has 2 children. 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  She denies any current or history of tobacco, alcohol, or illicit drug use.    HEALTH MAINTENANCE: Colonoscopy: uncertain PAP: S/P hysterectomy Bone density: 03/2014  PHYSICAL EXAMINATION:  Vital Signs: Filed Vitals:   08/06/15 1518  BP: 148/75  Pulse: 76  Temp: 98 F (36.7 C)  Resp: 18   Weight: 180.1 (stable) ECOG performance status: 0 General: Well-nourished, well-appearing female in no acute distress.  She is accompanied in clinic by her husband today.   HEENT: Head is atraumatic and normocephalic.  Pupils equal and reactive to light and accomodation. Conjunctivae clear without exudate.  Sclerae anicteric. Oral mucosa is pink, moist, and intact without lesions.  Oropharynx is pink without lesions or erythema.  Lymph: No cervical, supraclavicular, infraclavicular, or axillary lymphadenopathy noted on palpation.  Cardiovascular: Regular rate and rhythm without murmurs, rubs, or  gallops. Respiratory: Clear to auscultation bilaterally. Chest expansion symmetric without accessory muscle use on inspiration or expiration.  Breast: Bilateral breast exam performed.  Left lumpectomy scar present and well healed.  Thickening along left breast post radiation without discrete mass or nodule.  No mass or nodularity in right breast. GI: Abdomen soft and round. No tenderness to palpation. Bowel sounds normoactive in 4 quadrants. No hepatosplenomegaly.   GU: Deferred.  Musculoskeletal: Muscle strength 5/5 in all extremities.   Neuro: No focal deficits. Steady gait.  Psych: Mood and affect normal and appropriate for situation.  Extremities: No edema, cyanosis, or clubbing.  Skin: Warm and dry. No open lesions noted.   LABORATORY DATA:  No results found for this or any previous visit (from the past 2160 hour(s)).  DIAGNOSTIC IMAGING: Diagnostic bilateral mammogram performed 03/26/2015 shows scattered areas of fibroglandular density with no suspicious mass, distortion, or worrisome calcifications identified.  Breast density category B.     ASSESSMENT AND PLAN:   1. History of breast cancer: Stage IA invasive ductal carcinoma of the left breast (12/2009), ER positive, PR  positive, HER2/neu negative, S/P neoadjuvant endocrine therapy with letrozole initiated 12/2009, S/P lumpectomy/SLNB (06/2010) followed be adjuvant radiation therapy to the left breast with continued adjuvant endocrine therapy with letrozole completed in 12/2014.  [name] is doing well with no clinical symptoms worrisome for cancer recurrence at this time. I have reviewed the recommendations for ongoing surveillance with her and she will return to the Survivorship clinic in one year's time for evaluation with history and physical exam per surveillance protocol.  She will be due mammography in November 2017 and we will enter orders for this following today's visit. She was instructed to make [place] aware if she notes any change  within her breast, any new symptoms such as pain, shortness of breath, weight loss, or fatigue.    2. Bone health:  Given [name]'s age/history of breast cancer and her past treatment regimen including endocrine therapy with letrozole, she is at risk for bone demineralization.  Per our records, her last DEXA scan was performed in 2015 revealing osteopenia.  She states that she believes that her PCP will order this for her and we have discussed having it performed about the same time as her mammogram in November 2017. We will continue to monitor this closely for any further changes as a result of endocrine therapy.  In the meantime, she was encouraged to increase her consumption of foods rich in calcium and vitamin D as well as to increase her weight-bearing activities.  She was given education on specific activities to promote bone health.  3. Cancer screening:  Due to [name]'s history and her age, she should receive screening for skin cancers and colon cancer (pending GI's recommendations).  She is S/P hysterectomy. The information and recommendations were shared with the patient and in her written after visit summary.  4. Health maintenance and wellness promotion:[name] and I discussed recommendations to maximize nutrition and minimize recurrence, such as increased intake of fruits, vegetables, lean proteins, and minimizing the intake of red meats and processed foods.  She was also encouraged to engage in moderate to vigorous exercise for 30 minutes per day most days of the week. We discussed the [employer] fitness program, which is designed for cancer survivors to help them become more physically fit after cancer treatments.  She was instructed to limit her alcohol consumption and continue to abstain from tobacco use.      A total of 30 minutes of face-to-face time was spent with this patient with greater than 50% of that time in counseling and care-coordination.   Sylvan Cheese,  NP  Survivorship Program [facility] [phone]   Note: PRIMARY CARE PROVIDER Irven Shelling, [place] [phone]

## 2015-08-07 ENCOUNTER — Other Ambulatory Visit: Payer: Self-pay | Admitting: Nurse Practitioner

## 2015-08-07 DIAGNOSIS — C50912 Malignant neoplasm of unspecified site of left female breast: Secondary | ICD-10-CM

## 2015-08-08 ENCOUNTER — Other Ambulatory Visit: Payer: Self-pay | Admitting: Nurse Practitioner

## 2015-08-08 DIAGNOSIS — C50912 Malignant neoplasm of unspecified site of left female breast: Secondary | ICD-10-CM

## 2015-08-08 DIAGNOSIS — M8580 Other specified disorders of bone density and structure, unspecified site: Secondary | ICD-10-CM

## 2015-08-09 ENCOUNTER — Telehealth: Payer: Self-pay | Admitting: Nurse Practitioner

## 2015-08-09 NOTE — Telephone Encounter (Signed)
per pof to sch pt appt-cld & spoke to pt and gave pt time & date of appt for Dexa and mamma

## 2015-09-12 DIAGNOSIS — H5213 Myopia, bilateral: Secondary | ICD-10-CM | POA: Diagnosis not present

## 2015-09-12 DIAGNOSIS — H52203 Unspecified astigmatism, bilateral: Secondary | ICD-10-CM | POA: Diagnosis not present

## 2015-09-12 DIAGNOSIS — H43393 Other vitreous opacities, bilateral: Secondary | ICD-10-CM | POA: Diagnosis not present

## 2015-09-12 DIAGNOSIS — H524 Presbyopia: Secondary | ICD-10-CM | POA: Diagnosis not present

## 2015-09-12 DIAGNOSIS — H2513 Age-related nuclear cataract, bilateral: Secondary | ICD-10-CM | POA: Diagnosis not present

## 2015-12-06 DIAGNOSIS — I10 Essential (primary) hypertension: Secondary | ICD-10-CM | POA: Diagnosis not present

## 2016-01-04 IMAGING — MG MM DIAGNOSTIC BILATERAL
6 series · 6 of 6 positions shown · non-contrast
Comparison: Previous exams.

CLINICAL DATA: 78-year-old female with history of left breast
cancer post lumpectomy June 2010 followed by radiation therapy.

EXAM:
DIGITAL DIAGNOSTIC  BILATERAL MAMMOGRAM WITH CAD
ULTRASOUND RIGHT BREAST

[R CC]
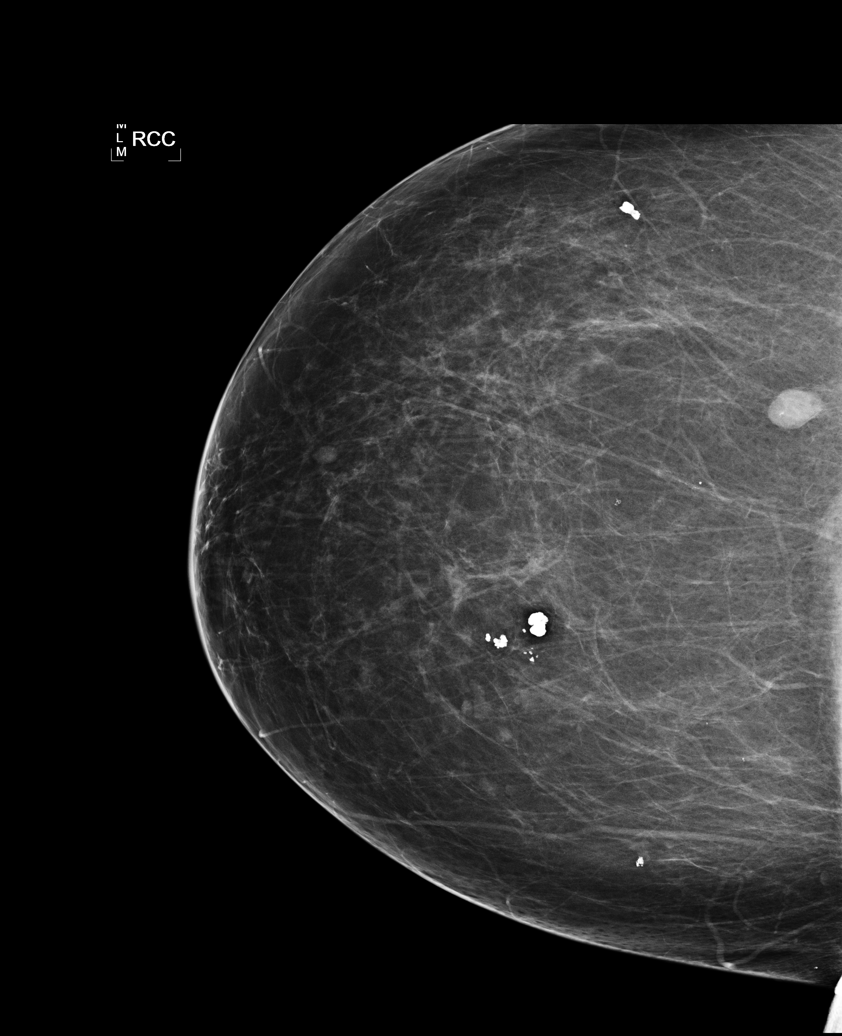

[L CC (1 of 2)]
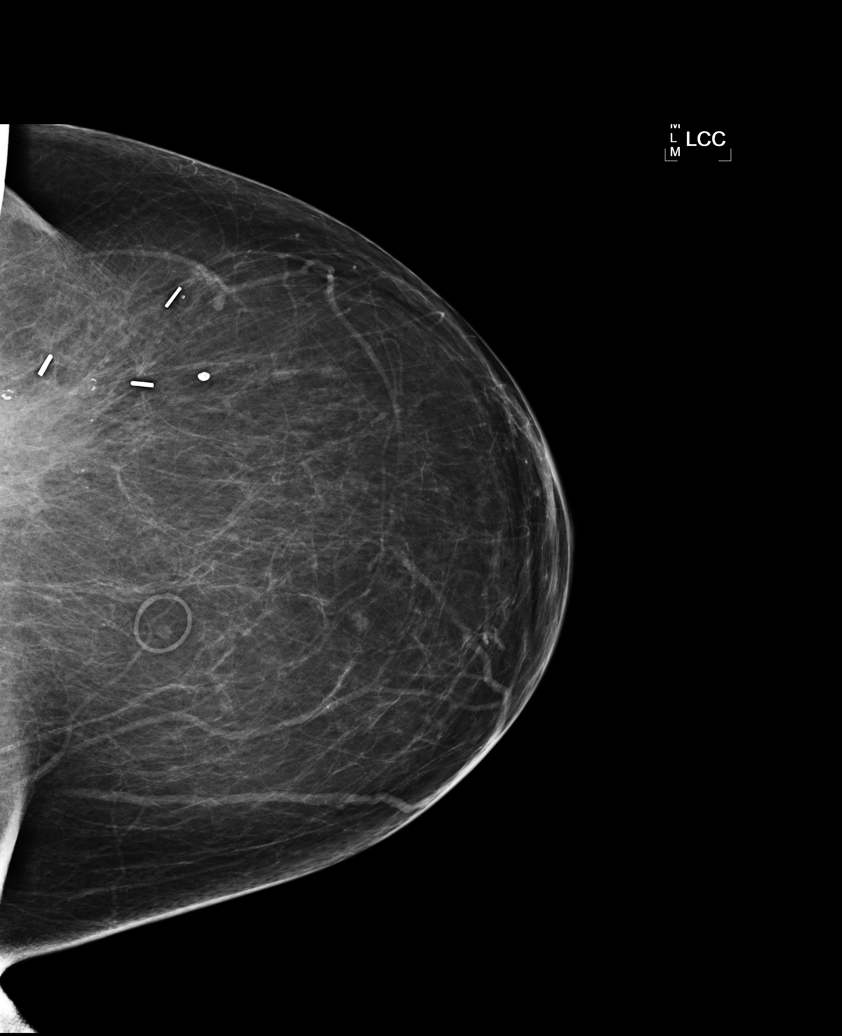

[L MLO]
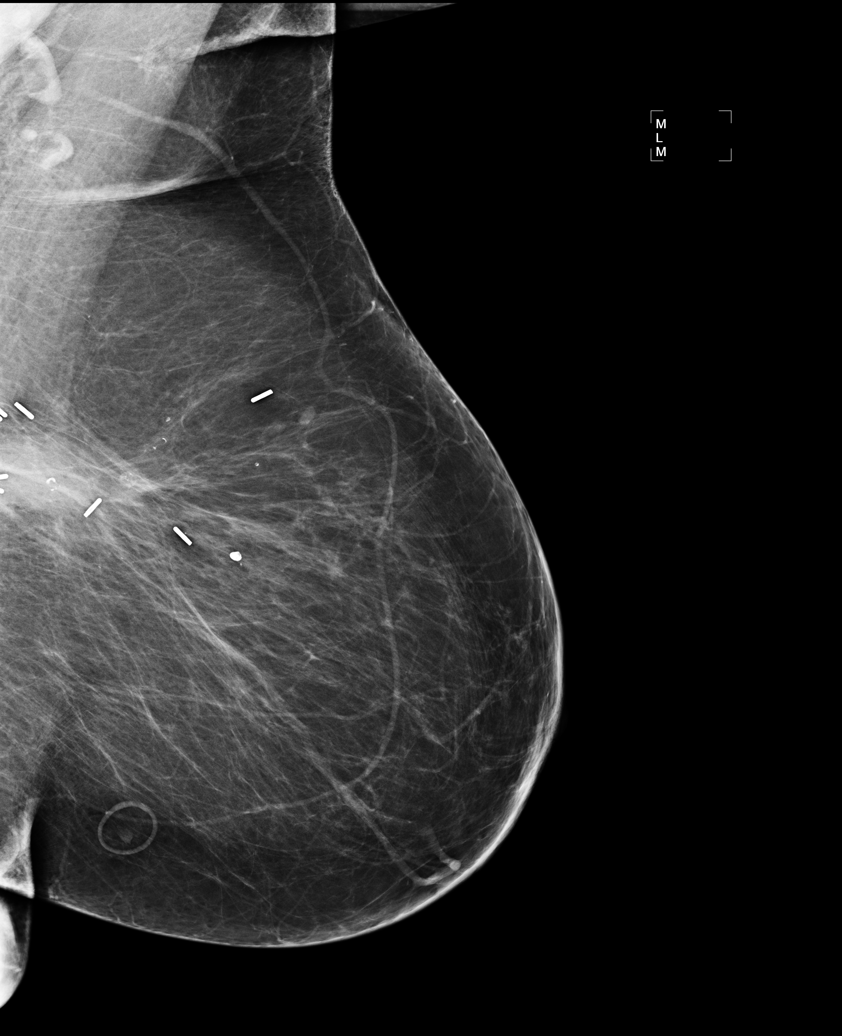

[R MLO]
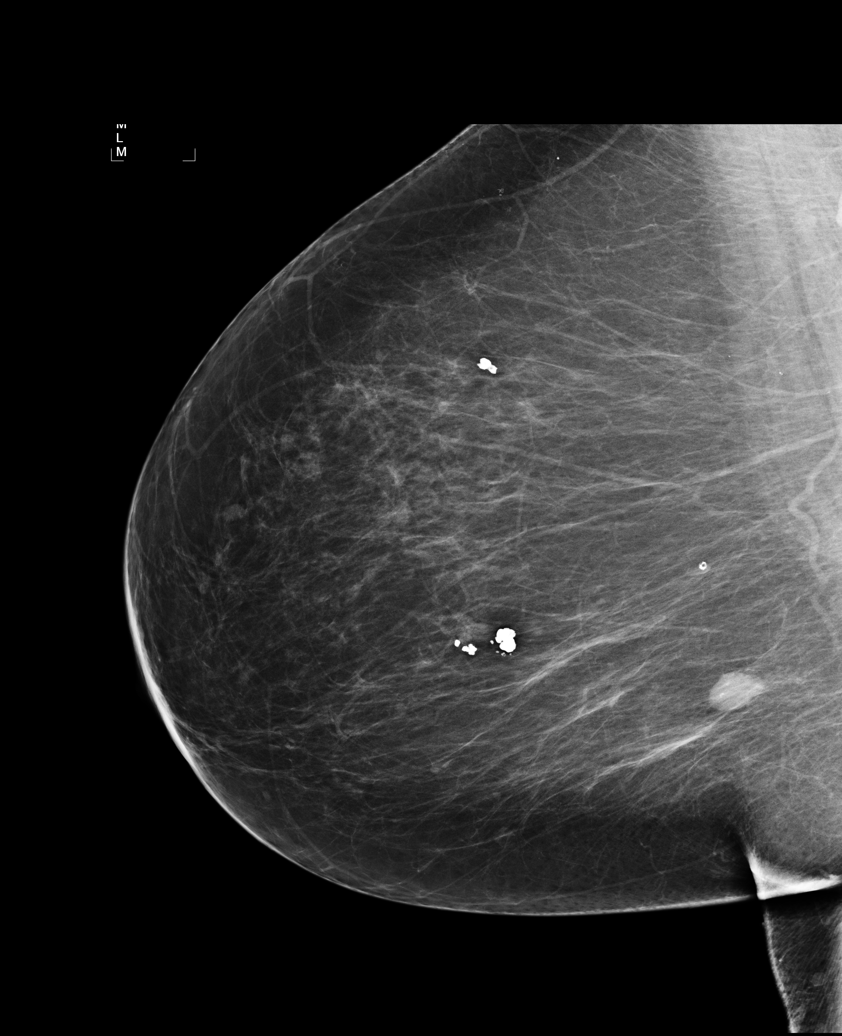

[L CC (2 of 2)]
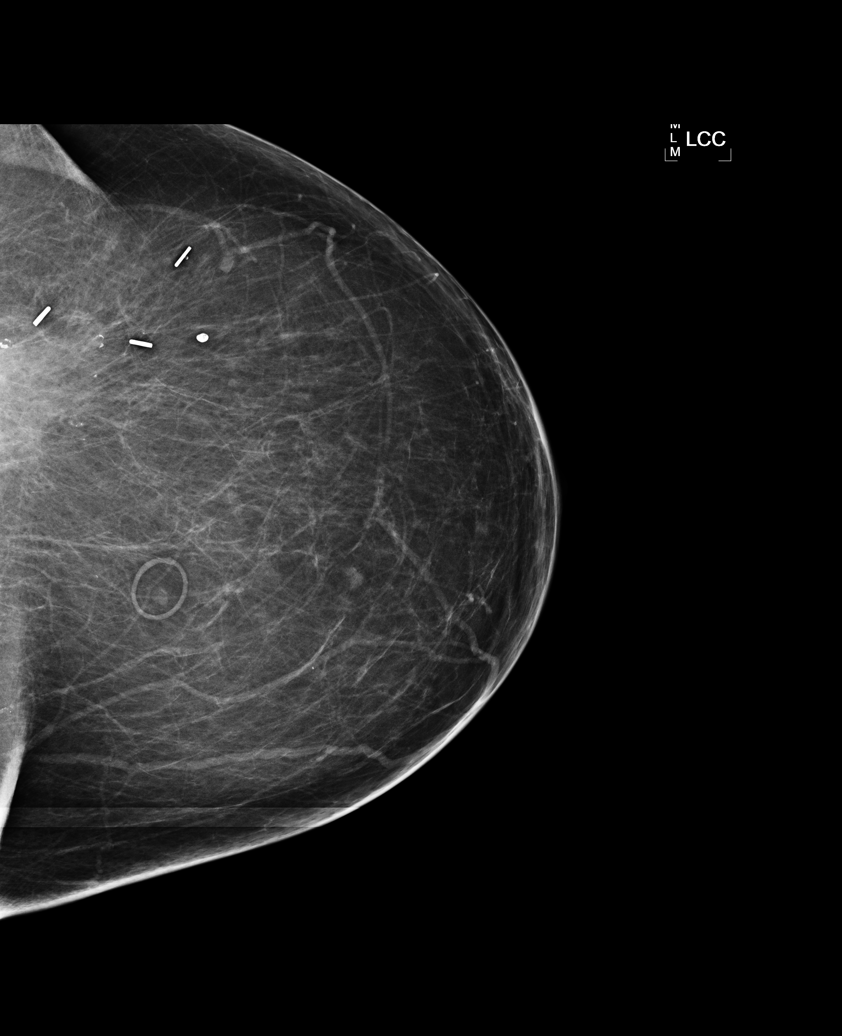

[L TAN]
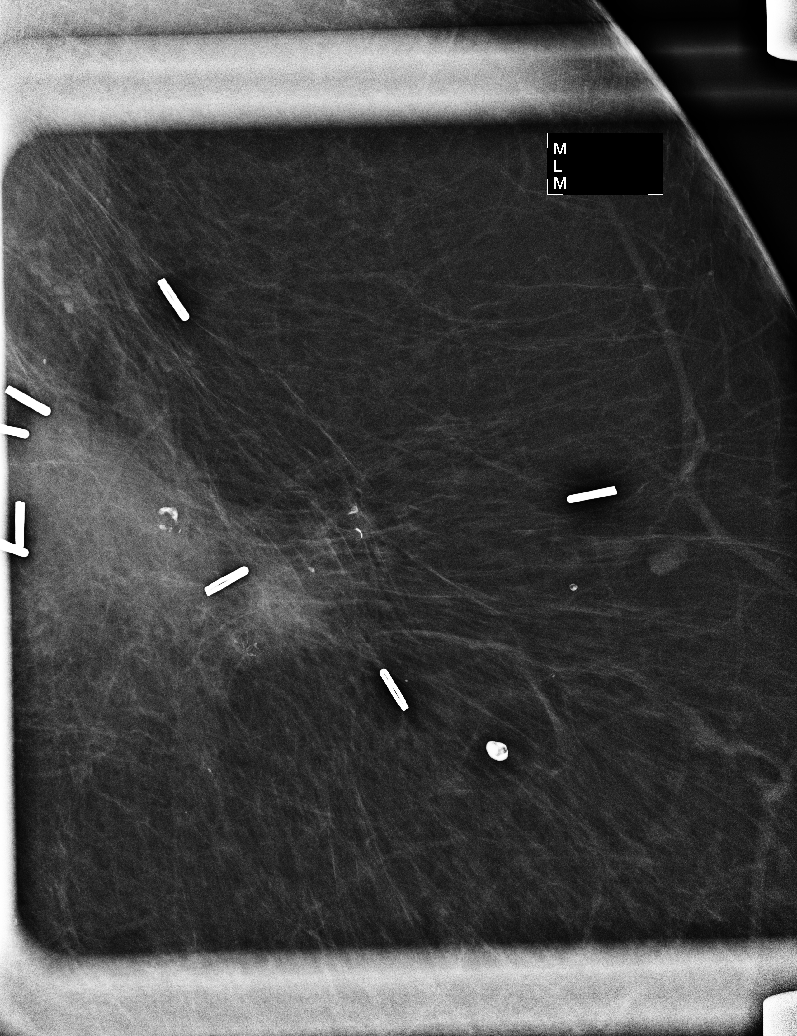

[6 of 6 positions shown; findings below may reference images not displayed]

ACR Breast Density Category b: There are scattered areas of
fibroglandular density.
FINDINGS: There is an oval circumscribed dense mass in the slightly outer
slightly lower right breast measuring 1.5 cm. No suspicious masses
or calcifications are seen in the left breast. Postsurgical changes
are present an the upper-outer left breast from prior lumpectomy. A
spot compression magnification tangential view of the lumpectomy
site in the left breast was performed. Developing dystrophic
calcifications are present at the lumpectomy site. There is no
mammographic evidence of locally recurrent malignancy.

Mammographic images were processed with CAD.

Physical examination of the lower outer right breast reveals a firm
nodule just beneath the skin surface suggestive of a sebaceous cyst.

Targeted ultrasound of the right breast was performed demonstrating
an oval circumscribed hypoechoic mass just beneath the skin surface
and with a claw sign with the adjacent skin measuring 1.1 x 0.6 x
1.1 cm. A small tract is seen leading to the skin surface. Findings
are consistent with a sebaceous cyst.
IMPRESSION: No mammographic evidence of malignancy in either breast.

RECOMMENDATION:
Diagnostic mammogram is suggested in 1 year. (Code:[id number])

I have discussed the findings and recommendations with the patient.
Results were also provided in writing at the conclusion of the
visit. If applicable, a reminder letter will be sent to the patient
regarding the next appointment.

BI-RADS CATEGORY  2: Benign.

## 2016-03-13 DIAGNOSIS — Z23 Encounter for immunization: Secondary | ICD-10-CM | POA: Diagnosis not present

## 2016-03-26 ENCOUNTER — Ambulatory Visit
Admission: RE | Admit: 2016-03-26 | Discharge: 2016-03-26 | Disposition: A | Discharge status: Home / Self Care | Payer: Medicare Other | Source: Ambulatory Visit | Attending: Nurse Practitioner | Admitting: Nurse Practitioner

## 2016-03-26 DIAGNOSIS — M810 Age-related osteoporosis without current pathological fracture: Secondary | ICD-10-CM | POA: Diagnosis not present

## 2016-03-26 DIAGNOSIS — Z780 Asymptomatic menopausal state: Secondary | ICD-10-CM | POA: Diagnosis not present

## 2016-03-26 DIAGNOSIS — R928 Other abnormal and inconclusive findings on diagnostic imaging of breast: Secondary | ICD-10-CM | POA: Diagnosis not present

## 2016-03-26 DIAGNOSIS — M8580 Other specified disorders of bone density and structure, unspecified site: Secondary | ICD-10-CM

## 2016-03-26 DIAGNOSIS — C50912 Malignant neoplasm of unspecified site of left female breast: Secondary | ICD-10-CM

## 2016-06-12 DIAGNOSIS — Z1389 Encounter for screening for other disorder: Secondary | ICD-10-CM | POA: Diagnosis not present

## 2016-06-12 DIAGNOSIS — I10 Essential (primary) hypertension: Secondary | ICD-10-CM | POA: Diagnosis not present

## 2016-06-12 DIAGNOSIS — Z0000 Encounter for general adult medical examination without abnormal findings: Secondary | ICD-10-CM | POA: Diagnosis not present

## 2016-06-12 DIAGNOSIS — F4320 Adjustment disorder, unspecified: Secondary | ICD-10-CM | POA: Diagnosis not present

## 2016-08-05 ENCOUNTER — Encounter: Payer: Medicare Other | Admitting: Nurse Practitioner

## 2016-08-07 ENCOUNTER — Ambulatory Visit (HOSPITAL_BASED_OUTPATIENT_CLINIC_OR_DEPARTMENT_OTHER): Payer: Medicare Other | Admitting: Adult Health

## 2016-08-07 ENCOUNTER — Encounter: Payer: Self-pay | Admitting: Adult Health

## 2016-08-07 ENCOUNTER — Telehealth: Payer: Self-pay | Admitting: Oncology

## 2016-08-07 VITALS — BP 129/59 | HR 76 | Temp 97.8°F | Resp 18 | Ht 64.0 in | Wt 168.9 lb

## 2016-08-07 DIAGNOSIS — Z170 Estrogen receptor positive status [ER+]: Secondary | ICD-10-CM

## 2016-08-07 DIAGNOSIS — C50912 Malignant neoplasm of unspecified site of left female breast: Secondary | ICD-10-CM

## 2016-08-07 DIAGNOSIS — Z853 Personal history of malignant neoplasm of breast: Secondary | ICD-10-CM

## 2016-08-07 DIAGNOSIS — M810 Age-related osteoporosis without current pathological fracture: Secondary | ICD-10-CM | POA: Diagnosis not present

## 2016-08-07 DIAGNOSIS — Z79811 Long term (current) use of aromatase inhibitors: Secondary | ICD-10-CM | POA: Diagnosis not present

## 2016-08-07 NOTE — Telephone Encounter (Signed)
sch appt and printed avs per LOS

## 2016-08-07 NOTE — Progress Notes (Signed)
CLINIC:  Survivorship   REASON FOR VISIT:  Routine follow-up for history of breast cancer.   BRIEF ONCOLOGIC HISTORY:    Breast cancer, left breast ([place])   12/2009 - 12/2014 Neo-Adjuvant Anti-estrogen oral therapy    Letrozole 2.5 mg      06/24/2010 Definitive Surgery    Left lumpectomy/SLNB: IDC, grade 2, ER+ (100%), PR+ (100%), HER2/neu negative, Ki67 63%.      06/24/2010 Pathologic Stage    Stage IA: mpT1c pN0      07/11/2010 Surgery    Rexcision of margin      08/21/2010 - 10/03/2010 Radiation Therapy    Adjuvant RT to left breast      02/26/2012 Mammogram    Left breast: solitary linear calcification likely representing a developing fat necrosis        INTERVAL HISTORY:  [name] presents to the [facility] today for routine follow-up for her history of breast cancer.  Overall, she reports feeling quite well. [name] is doing well today.  She is c/o short narrow pain intermittently in her umbilicus.  This has been going on for about 2 months.  She has thoracic back pain that has been going on for several years and dates back prior to her breast cancer diagnosis.  She has never had xrays.  She thinks it is musculoskeletal.     REVIEW OF SYSTEMS:  Review of Systems  Constitutional: Negative for chills, fever, malaise/fatigue and weight loss.  HENT: Negative for ear pain, hearing loss and tinnitus.   Eyes: Negative for blurred vision and double vision.  Respiratory: Negative for cough and shortness of breath.   Cardiovascular: Negative for chest pain, palpitations and leg swelling.  Gastrointestinal: Negative for abdominal pain, constipation, diarrhea, heartburn, nausea and vomiting.  Genitourinary: Negative for dysuria and urgency.  Musculoskeletal: Negative for myalgias.  Skin: Negative for itching and rash.  Neurological: Negative for dizziness and headaches.  Endo/Heme/Allergies: Negative for environmental allergies. Does not bruise/bleed easily.    Psychiatric/Behavioral: Negative for depression and memory loss. The patient is not nervous/anxious and does not have insomnia.   Breast: Denies any new nodularity, masses, tenderness, nipple changes, or nipple discharge.       PAST MEDICAL/SURGICAL HISTORY:  Past Medical History:  Diagnosis Date  . Breast cancer ([place])   . Gall stones   . Hypertension   . Menorrhagia   . Migraine   . Shortness of breath    once a year- gets checked by Dr.  . [name] incontinence    Past Surgical History:  Procedure Laterality Date  . ABDOMINAL HYSTERECTOMY     Ovaries retained  . BLADDER SURGERY     Bladder Tack  . BREAST LUMPECTOMY Left 01/02/10  . BREAST MASS EXCISION     Benign lump removal  . CARPAL TUNNEL RELEASE Right 01/11/2013   Procedure: CARPAL TUNNEL RELEASE, RELEASE A-1 PULLEY RIGHT INDEX FINGER;  Surgeon: [name]., MD;  Location: [place];  Service: Orthopedics;  Laterality: Right;  . CHOLECYSTECTOMY    . SHOULDER SURGERY Bilateral      ALLERGIES:  Allergies  Allergen Reactions  . Amoxicillin Diarrhea    Per patient severe diarrhea required hospilization     CURRENT MEDICATIONS:  Outpatient Encounter Prescriptions as of 08/07/2016  Medication Sig  . calcium carbonate (OS-CAL) 600 MG TABS Take 600 mg by mouth daily with breakfast.   . cholecalciferol (VITAMIN D) 1000 UNITS tablet Take 1,000 Units by mouth daily.   Marland Kitchen  KLOR-CON M20 20 MEQ tablet Take 20 mEq by mouth daily.   . metroNIDAZOLE (METROCREAM) 0.75 % cream Apply 1 application topically daily.   . Multiple Vitamin (MULTIVITAMIN) tablet Take 1 tablet by mouth daily.  . Triamterene-HCTZ (MAXZIDE PO) Take 25 mg by mouth daily.   No facility-administered encounter medications on file as of 08/07/2016.      ONCOLOGIC FAMILY HISTORY:  Family History  Problem Relation Age of Onset  . Heart Problems Mother   . Hypertension Mother   . Diabetes Father   . Cancer Sister     Lung and  Ovarian  . Diabetes Sister   . Heart Problems Brother   . Diabetes Brother   . Diabetes Sister   . Diabetes Sister     SOCIAL HISTORY:  [name] is widowed and lives alone in [place], [place].  She has a daughter who lives in [place] and her son lives in [place], [place].  [name] is currently retired.  She denies any current or history of tobacco, alcohol, or illicit drug use.     PHYSICAL EXAMINATION:  Vital Signs: Vitals:   08/07/16 1321  BP: (!) 129/59  Pulse: 76  Resp: 18  Temp: 97.8 F (36.6 C)   Filed Weights   08/07/16 1321  Weight: 168 lb 14.4 oz (76.6 kg)   General: Well-nourished, well-appearing female in no acute distress.  Unaccompanied HEENT: Head is normocephalic.  Pupils equal and reactive to light. Conjunctivae clear without exudate.  Sclerae anicteric. Oral mucosa is pink, moist.  Oropharynx is pink without lesions or erythema.  Lymph: No cervical, supraclavicular, or infraclavicular lymphadenopathy noted on palpation.  Cardiovascular: Regular rate and rhythm.Marland Kitchen Respiratory: Clear to auscultation bilaterally. Chest expansion symmetric; breathing non-labored.  Breast Exam:  -Left breast: No appreciable masses on palpation. No skin redness, thickening, or peau d'orange appearance; no nipple retraction or nipple discharge; mild distortion in symmetry at previous lumpectomy site well healed scar without erythema or nodularity.  -Right breast: No appreciable masses on palpation. No skin redness, thickening, or peau d'orange appearance; no nipple retraction or nipple discharge;  -Axilla: No axillary adenopathy bilaterally.  GI: Abdomen soft and round; non-tender, non-distended. Bowel sounds normoactive. No hepatosplenomegaly.   GU: Deferred.  Neuro: No focal deficits. Steady gait.  Psych: Mood and affect normal and appropriate for situation.  Extremities: No edema. Skin: Warm and dry.  LABORATORY DATA:  None for this visit   DIAGNOSTIC IMAGING:    Most recent mammogram:      ASSESSMENT AND PLAN:  [name] is a pleasant 81 y.o. female with history of Stage IA left breast invasive ductal carcinoma, ER+/PR+/HER2-, diagnosed in 2011, treated with neoadjuvant Letrozole, lumpectomy, adjuvant radiation therapy, and anti-estrogen therapy with letrozole for 5 years.  She [facility] for surveillance and routine follow-up.   1. History of breast cancer:  [name] is currently clinically and radiographically without evidence of disease or recurrence of breast cancer. She will be due for mammogram in 03/2017; orders placed today.  I encouraged her to call me with any questions or concerns before her next visit at the cancer center, and I would be happy to see her sooner, if needed.    2. Bone health:  Given [name]'s age, history of breast cancer, and her previous anti-estrogen therapy with Letrozole, she is at risk for bone demineralization. Her last DEXA scan was in 2017 and was consistent with osteoporosis.  I encouraged her to  increase her consumption of foods rich in calcium, as well as increase her weight-bearing activities.  She was given education on specific food and activities to promote bone health.  3. Cancer screening:  Due to [name]'s history and her age, she should receive screening for skin cancers and colon cancer.. She was encouraged to follow-up with her PCP for appropriate cancer screenings.   4. Health maintenance and wellness promotion: [name] was encouraged to consume 5-7 servings of fruits and vegetables per day. She was also encouraged to engage in moderate to vigorous exercise for 30 minutes per day most days of the week. She was instructed to limit her alcohol consumption and continue to abstain from tobacco use.    Dispo:  -Return to cancer center in one year for follow up   A total of (30) minutes of face-to-face time was spent with this patient with greater than 50% of that time in  counseling and care-coordination.   Gardenia Phlegm, [place] [phone]   Note: PRIMARY CARE PROVIDER Irven Shelling, [place] [phone]

## 2016-08-14 DIAGNOSIS — L820 Inflamed seborrheic keratosis: Secondary | ICD-10-CM | POA: Diagnosis not present

## 2016-08-14 DIAGNOSIS — L718 Other rosacea: Secondary | ICD-10-CM | POA: Diagnosis not present

## 2016-08-28 DIAGNOSIS — L820 Inflamed seborrheic keratosis: Secondary | ICD-10-CM | POA: Diagnosis not present

## 2016-09-17 DIAGNOSIS — H524 Presbyopia: Secondary | ICD-10-CM | POA: Diagnosis not present

## 2016-09-17 DIAGNOSIS — H2513 Age-related nuclear cataract, bilateral: Secondary | ICD-10-CM | POA: Diagnosis not present

## 2016-09-17 DIAGNOSIS — H5213 Myopia, bilateral: Secondary | ICD-10-CM | POA: Diagnosis not present

## 2016-09-17 DIAGNOSIS — H269 Unspecified cataract: Secondary | ICD-10-CM | POA: Diagnosis not present

## 2016-09-17 DIAGNOSIS — H52203 Unspecified astigmatism, bilateral: Secondary | ICD-10-CM | POA: Diagnosis not present

## 2016-12-15 DIAGNOSIS — I10 Essential (primary) hypertension: Secondary | ICD-10-CM | POA: Diagnosis not present

## 2016-12-15 DIAGNOSIS — R002 Palpitations: Secondary | ICD-10-CM | POA: Diagnosis not present

## 2017-02-19 DIAGNOSIS — Z23 Encounter for immunization: Secondary | ICD-10-CM | POA: Diagnosis not present

## 2017-03-27 ENCOUNTER — Ambulatory Visit
Admission: RE | Admit: 2017-03-27 | Discharge: 2017-03-27 | Disposition: A | Discharge status: Home / Self Care | Payer: Medicare Other | Source: Ambulatory Visit | Attending: Adult Health | Admitting: Adult Health

## 2017-03-27 DIAGNOSIS — R928 Other abnormal and inconclusive findings on diagnostic imaging of breast: Secondary | ICD-10-CM | POA: Diagnosis not present

## 2017-03-27 DIAGNOSIS — Z170 Estrogen receptor positive status [ER+]: Principal | ICD-10-CM

## 2017-03-27 DIAGNOSIS — C50912 Malignant neoplasm of unspecified site of left female breast: Secondary | ICD-10-CM

## 2017-03-27 HISTORY — DX: Personal history of irradiation: Z92.3

## 2017-06-15 DIAGNOSIS — Z0000 Encounter for general adult medical examination without abnormal findings: Secondary | ICD-10-CM | POA: Diagnosis not present

## 2017-06-15 DIAGNOSIS — Z1389 Encounter for screening for other disorder: Secondary | ICD-10-CM | POA: Diagnosis not present

## 2017-06-15 DIAGNOSIS — I129 Hypertensive chronic kidney disease with stage 1 through stage 4 chronic kidney disease, or unspecified chronic kidney disease: Secondary | ICD-10-CM | POA: Diagnosis not present

## 2017-06-15 DIAGNOSIS — N183 Chronic kidney disease, stage 3 (moderate): Secondary | ICD-10-CM | POA: Diagnosis not present

## 2017-06-15 DIAGNOSIS — M810 Age-related osteoporosis without current pathological fracture: Secondary | ICD-10-CM | POA: Diagnosis not present

## 2017-06-15 DIAGNOSIS — F4320 Adjustment disorder, unspecified: Secondary | ICD-10-CM | POA: Diagnosis not present

## 2017-07-27 ENCOUNTER — Encounter: Payer: Self-pay | Admitting: Genetics

## 2017-08-05 ENCOUNTER — Telehealth: Payer: Self-pay | Admitting: Adult Health

## 2017-08-05 NOTE — Telephone Encounter (Signed)
Patient called to cancel °

## 2017-08-06 ENCOUNTER — Encounter: Payer: Medicare Other | Admitting: Adult Health

## 2017-12-14 ENCOUNTER — Other Ambulatory Visit: Payer: Self-pay | Admitting: Internal Medicine

## 2017-12-14 DIAGNOSIS — R1031 Right lower quadrant pain: Secondary | ICD-10-CM

## 2017-12-14 DIAGNOSIS — R1011 Right upper quadrant pain: Secondary | ICD-10-CM | POA: Diagnosis not present

## 2017-12-22 ENCOUNTER — Ambulatory Visit
Admission: RE | Admit: 2017-12-22 | Discharge: 2017-12-22 | Disposition: A | Discharge status: Home / Self Care | Payer: PPO | Source: Ambulatory Visit | Attending: Internal Medicine | Admitting: Internal Medicine

## 2017-12-22 DIAGNOSIS — R1031 Right lower quadrant pain: Secondary | ICD-10-CM

## 2017-12-22 MED ORDER — IOPAMIDOL (ISOVUE-300) INJECTION 61%
100.0000 mL | Freq: Once | INTRAVENOUS | Status: AC | PRN
  Administered 2017-12-22: 100 mL via INTRAVENOUS

## 2017-12-28 DIAGNOSIS — H524 Presbyopia: Secondary | ICD-10-CM | POA: Diagnosis not present

## 2017-12-28 DIAGNOSIS — H2513 Age-related nuclear cataract, bilateral: Secondary | ICD-10-CM | POA: Diagnosis not present

## 2017-12-28 DIAGNOSIS — H52203 Unspecified astigmatism, bilateral: Secondary | ICD-10-CM | POA: Diagnosis not present

## 2017-12-28 DIAGNOSIS — H5213 Myopia, bilateral: Secondary | ICD-10-CM | POA: Diagnosis not present

## 2018-01-28 DIAGNOSIS — Z23 Encounter for immunization: Secondary | ICD-10-CM | POA: Diagnosis not present

## 2018-02-04 DIAGNOSIS — L718 Other rosacea: Secondary | ICD-10-CM | POA: Diagnosis not present

## 2018-03-11 ENCOUNTER — Other Ambulatory Visit: Payer: Self-pay | Admitting: Oncology

## 2018-03-11 ENCOUNTER — Other Ambulatory Visit: Payer: Self-pay | Admitting: Adult Health

## 2018-03-11 DIAGNOSIS — Z1231 Encounter for screening mammogram for malignant neoplasm of breast: Secondary | ICD-10-CM

## 2018-03-21 ENCOUNTER — Other Ambulatory Visit: Payer: Self-pay | Admitting: Adult Health

## 2018-03-21 DIAGNOSIS — C50912 Malignant neoplasm of unspecified site of left female breast: Secondary | ICD-10-CM

## 2018-03-21 DIAGNOSIS — G43909 Migraine, unspecified, not intractable, without status migrainosus: Secondary | ICD-10-CM

## 2018-03-21 DIAGNOSIS — M8580 Other specified disorders of bone density and structure, unspecified site: Secondary | ICD-10-CM

## 2018-03-23 ENCOUNTER — Other Ambulatory Visit: Payer: Self-pay | Admitting: Adult Health

## 2018-03-23 DIAGNOSIS — M8580 Other specified disorders of bone density and structure, unspecified site: Secondary | ICD-10-CM

## 2018-03-23 DIAGNOSIS — G43909 Migraine, unspecified, not intractable, without status migrainosus: Secondary | ICD-10-CM

## 2018-03-23 DIAGNOSIS — C50912 Malignant neoplasm of unspecified site of left female breast: Secondary | ICD-10-CM

## 2018-04-20 ENCOUNTER — Ambulatory Visit
Admission: RE | Admit: 2018-04-20 | Discharge: 2018-04-20 | Disposition: A | Discharge status: Home / Self Care | Payer: PPO | Source: Ambulatory Visit | Attending: Oncology | Admitting: Oncology

## 2018-04-20 DIAGNOSIS — Z1231 Encounter for screening mammogram for malignant neoplasm of breast: Secondary | ICD-10-CM

## 2018-05-10 DIAGNOSIS — R350 Frequency of micturition: Secondary | ICD-10-CM | POA: Diagnosis not present

## 2018-06-21 ENCOUNTER — Other Ambulatory Visit: Payer: Self-pay | Admitting: Internal Medicine

## 2018-06-21 DIAGNOSIS — Z1389 Encounter for screening for other disorder: Secondary | ICD-10-CM | POA: Diagnosis not present

## 2018-06-21 DIAGNOSIS — R002 Palpitations: Secondary | ICD-10-CM | POA: Diagnosis not present

## 2018-06-21 DIAGNOSIS — Z0000 Encounter for general adult medical examination without abnormal findings: Secondary | ICD-10-CM | POA: Diagnosis not present

## 2018-06-21 DIAGNOSIS — M810 Age-related osteoporosis without current pathological fracture: Secondary | ICD-10-CM | POA: Diagnosis not present

## 2018-06-21 DIAGNOSIS — N183 Chronic kidney disease, stage 3 (moderate): Secondary | ICD-10-CM | POA: Diagnosis not present

## 2018-06-21 DIAGNOSIS — I129 Hypertensive chronic kidney disease with stage 1 through stage 4 chronic kidney disease, or unspecified chronic kidney disease: Secondary | ICD-10-CM | POA: Diagnosis not present

## 2018-07-07 ENCOUNTER — Other Ambulatory Visit: Payer: Self-pay | Admitting: Internal Medicine

## 2018-07-07 ENCOUNTER — Ambulatory Visit (INDEPENDENT_AMBULATORY_CARE_PROVIDER_SITE_OTHER): Payer: PPO

## 2018-07-07 DIAGNOSIS — R002 Palpitations: Secondary | ICD-10-CM

## 2018-08-11 ENCOUNTER — Other Ambulatory Visit: Payer: PPO

## 2018-08-24 DIAGNOSIS — R209 Unspecified disturbances of skin sensation: Secondary | ICD-10-CM | POA: Diagnosis not present

## 2018-08-24 DIAGNOSIS — R972 Elevated prostate specific antigen [PSA]: Secondary | ICD-10-CM | POA: Diagnosis not present

## 2018-10-14 ENCOUNTER — Other Ambulatory Visit: Payer: PPO

## 2018-11-30 DIAGNOSIS — L03031 Cellulitis of right toe: Secondary | ICD-10-CM | POA: Diagnosis not present

## 2018-11-30 DIAGNOSIS — M79676 Pain in unspecified toe(s): Secondary | ICD-10-CM | POA: Diagnosis not present

## 2018-12-02 ENCOUNTER — Other Ambulatory Visit: Payer: PPO

## 2018-12-23 DIAGNOSIS — L03031 Cellulitis of right toe: Secondary | ICD-10-CM | POA: Diagnosis not present

## 2018-12-30 DIAGNOSIS — H25813 Combined forms of age-related cataract, bilateral: Secondary | ICD-10-CM | POA: Diagnosis not present

## 2018-12-30 DIAGNOSIS — H524 Presbyopia: Secondary | ICD-10-CM | POA: Diagnosis not present

## 2018-12-30 DIAGNOSIS — H52203 Unspecified astigmatism, bilateral: Secondary | ICD-10-CM | POA: Diagnosis not present

## 2018-12-30 DIAGNOSIS — H5213 Myopia, bilateral: Secondary | ICD-10-CM | POA: Diagnosis not present

## 2019-01-11 DIAGNOSIS — L03031 Cellulitis of right toe: Secondary | ICD-10-CM | POA: Diagnosis not present

## 2019-01-11 DIAGNOSIS — B351 Tinea unguium: Secondary | ICD-10-CM | POA: Diagnosis not present

## 2019-02-07 ENCOUNTER — Ambulatory Visit
Admission: RE | Admit: 2019-02-07 | Discharge: 2019-02-07 | Disposition: A | Discharge status: Home / Self Care | Payer: PPO | Source: Ambulatory Visit | Attending: Internal Medicine | Admitting: Internal Medicine

## 2019-02-07 ENCOUNTER — Other Ambulatory Visit: Payer: Self-pay

## 2019-02-07 DIAGNOSIS — M810 Age-related osteoporosis without current pathological fracture: Secondary | ICD-10-CM

## 2019-02-07 DIAGNOSIS — Z780 Asymptomatic menopausal state: Secondary | ICD-10-CM | POA: Diagnosis not present

## 2019-02-07 DIAGNOSIS — M8589 Other specified disorders of bone density and structure, multiple sites: Secondary | ICD-10-CM | POA: Diagnosis not present

## 2019-03-16 DIAGNOSIS — Z23 Encounter for immunization: Secondary | ICD-10-CM | POA: Diagnosis not present

## 2019-03-24 ENCOUNTER — Other Ambulatory Visit: Payer: Self-pay | Admitting: Internal Medicine

## 2019-03-24 DIAGNOSIS — Z1231 Encounter for screening mammogram for malignant neoplasm of breast: Secondary | ICD-10-CM

## 2019-04-19 DIAGNOSIS — L03032 Cellulitis of left toe: Secondary | ICD-10-CM | POA: Diagnosis not present

## 2019-04-19 DIAGNOSIS — M79672 Pain in left foot: Secondary | ICD-10-CM | POA: Diagnosis not present

## 2019-05-03 DIAGNOSIS — L03032 Cellulitis of left toe: Secondary | ICD-10-CM | POA: Diagnosis not present

## 2019-05-03 DIAGNOSIS — M79676 Pain in unspecified toe(s): Secondary | ICD-10-CM | POA: Diagnosis not present

## 2019-05-17 ENCOUNTER — Other Ambulatory Visit: Payer: Self-pay

## 2019-05-17 ENCOUNTER — Ambulatory Visit
Admission: RE | Admit: 2019-05-17 | Discharge: 2019-05-17 | Disposition: A | Discharge status: Home / Self Care | Payer: PPO | Source: Ambulatory Visit | Attending: Internal Medicine | Admitting: Internal Medicine

## 2019-05-17 DIAGNOSIS — Z1231 Encounter for screening mammogram for malignant neoplasm of breast: Secondary | ICD-10-CM

## 2019-05-26 DIAGNOSIS — M79676 Pain in unspecified toe(s): Secondary | ICD-10-CM | POA: Diagnosis not present

## 2019-05-26 DIAGNOSIS — B351 Tinea unguium: Secondary | ICD-10-CM | POA: Diagnosis not present

## 2019-06-09 DIAGNOSIS — L718 Other rosacea: Secondary | ICD-10-CM | POA: Diagnosis not present

## 2019-08-18 DIAGNOSIS — M810 Age-related osteoporosis without current pathological fracture: Secondary | ICD-10-CM | POA: Diagnosis not present

## 2019-08-18 DIAGNOSIS — R002 Palpitations: Secondary | ICD-10-CM | POA: Diagnosis not present

## 2019-08-18 DIAGNOSIS — I10 Essential (primary) hypertension: Secondary | ICD-10-CM | POA: Diagnosis not present

## 2019-08-18 DIAGNOSIS — Z1389 Encounter for screening for other disorder: Secondary | ICD-10-CM | POA: Diagnosis not present

## 2019-08-18 DIAGNOSIS — G43909 Migraine, unspecified, not intractable, without status migrainosus: Secondary | ICD-10-CM | POA: Diagnosis not present

## 2019-08-18 DIAGNOSIS — Z0000 Encounter for general adult medical examination without abnormal findings: Secondary | ICD-10-CM | POA: Diagnosis not present

## 2019-08-22 DIAGNOSIS — R002 Palpitations: Secondary | ICD-10-CM | POA: Diagnosis not present

## 2019-09-16 DIAGNOSIS — R002 Palpitations: Secondary | ICD-10-CM | POA: Diagnosis not present

## 2019-09-21 DIAGNOSIS — R000 Tachycardia, unspecified: Secondary | ICD-10-CM | POA: Diagnosis not present

## 2019-10-21 ENCOUNTER — Ambulatory Visit: Payer: PPO | Admitting: Cardiology

## 2019-10-21 ENCOUNTER — Encounter: Payer: Self-pay | Admitting: Cardiology

## 2019-10-21 ENCOUNTER — Other Ambulatory Visit: Payer: Self-pay

## 2019-10-21 ENCOUNTER — Encounter: Payer: Self-pay | Admitting: *Deleted

## 2019-10-21 VITALS — BP 140/90 | HR 76 | Ht 64.0 in | Wt 177.0 lb

## 2019-10-21 DIAGNOSIS — I471 Supraventricular tachycardia: Secondary | ICD-10-CM

## 2019-10-21 DIAGNOSIS — I208 Other forms of angina pectoris: Secondary | ICD-10-CM

## 2019-10-21 DIAGNOSIS — R0600 Dyspnea, unspecified: Secondary | ICD-10-CM

## 2019-10-21 DIAGNOSIS — R0609 Other forms of dyspnea: Secondary | ICD-10-CM

## 2019-10-21 NOTE — Progress Notes (Signed)
Cardiology Office Note:    Date:  10/21/2019   ID:  [name], DOB [birth year], MRN [id number]  PCP:  Kaitlyn Orn, MD  [facility] Good Cardiologist:  No primary care provider on file.  [name] Electrophysiologist:  None   Referring MD: Kaitlyn Orn, MD     History of Present Illness:    [name] is a 84 y.o. female here for the evaluation of abnormal event monitor, pauses at the request of Dr. Lavone Good.  Holter 2020: Sinus rhythm   53 to 99 bpm   Average HR 69 bpm Occasional PVC No significant arrhythmias No diary entries    Felt like heart race out of chest for 10 seconds. Sink just happened. Sat down. Fine. Can be sitting with flutter.   Dyspnea - not normal, tired. Stairs. Widowed. No syncope.   No prior heart issues. Mother died with MI, brother died with MI, AFIB in sister.    Past Medical History:  Diagnosis Date   Ankle tendinitis    Breast cancer ([place])    Breast cancer, left breast ([place]) 08/09/2012   Gall stones    Hip bursitis    Hypertension    Menorrhagia    Migraine    Osteopenia 03/27/2014   Osteoporosis    Personal history of radiation therapy 2012   Left Breast Cancer   Plantar fasciitis    RLS (restless legs syndrome)    Shortness of breath    once a year- gets checked by Dr.   Rebeca Good incontinence    Trigger finger    CTS    Past Surgical History:  Procedure Laterality Date   ABDOMINAL HYSTERECTOMY     Ovaries retained   BLADDER SURGERY     Bladder Tack   BREAST LUMPECTOMY Left 2012   BREAST MASS EXCISION     Benign lump removal   CARPAL TUNNEL RELEASE Right 01/11/2013   Procedure: CARPAL TUNNEL RELEASE, RELEASE A-1 PULLEY RIGHT INDEX FINGER;  Surgeon: [name]., MD;  Location: [place];  Service: Orthopedics;  Laterality: Right;   CHOLECYSTECTOMY     SHOULDER SURGERY Bilateral     Current Medications: Current Meds  Medication Sig   KLOR-CON M20 20 MEQ  tablet Take 20 mEq by mouth daily.    Triamterene-HCTZ (MAXZIDE PO) Take 25 mg by mouth daily.     Allergies:   Amoxicillin   Social History   Socioeconomic History   Marital status: Married    Spouse name: Not on file   Number of children: Not on file   Years of education: Not on file   Highest education level: Not on file  Occupational History   Not on file  Tobacco Use   Smoking status: Never Smoker   Smokeless tobacco: Never Used  Substance and Sexual Activity   Alcohol use: No   Drug use: No   Sexual activity: Never    Birth control/protection: Post-menopausal  Other Topics Concern   Not on file  Social History Narrative   Not on file   Social Determinants of Health   Financial Resource Strain:    Difficulty of Paying Living Expenses:   Food Insecurity:    Worried About Charity fundraiser in the Last Year:    Arboriculturist in the Last Year:   Transportation Needs:    Lack of Transportation (Medical):    Lack of Transportation (Non-Medical):   Physical Activity:    Days of  Exercise per Week:    Minutes of Exercise per Session:   Stress:    Feeling of Stress :   Social Connections:    Frequency of Communication with Friends and Family:    Frequency of Social Gatherings with Friends and Family:    Attends Religious Services:    Active Member of Clubs or Organizations:    Attends Music therapist:    Marital Status:      Family History: The patient's family history includes Breast cancer in her maternal grandmother; Cancer in her sister; Diabetes in her brother, father, sister, sister, and sister; Heart Problems in her brother and mother; Hypertension in her mother.  ROS:   Please see the history of present illness.     All other systems reviewed and are negative.  EKGs/Labs/Other Studies Reviewed:    The following studies were reviewed today: Zio patch monitor  EKG:  EKG is  ordered today.  The ekg ordered  today demonstrates normal sinus rhythm no other abnormalities  Recent Labs: No results found for requested labs within last 8760 hours.  Recent Lipid Panel No results found for: CHOL, TRIG, HDL, CHOLHDL, VLDL, LDLCALC, LDLDIRECT  Physical Exam:    VS:  BP 140/90    Pulse 76    Ht 5\' 4"  (1.626 m)    Wt 177 lb (80.3 kg)    SpO2 97%    BMI 30.38 kg/m     Wt Readings from Last 3 Encounters:  10/21/19 177 lb (80.3 kg)  08/07/16 168 lb 14.4 oz (76.6 kg)  08/06/15 180 lb 1.6 oz (81.7 kg)     GEN: Looks younger than stated age well nourished, well developed in no acute distress HEENT: Normal NECK: No JVD; No carotid bruits LYMPHATICS: No lymphadenopathy CARDIAC: RRR, no murmurs, rubs, gallops RESPIRATORY:  Clear to auscultation without rales, wheezing or rhonchi  ABDOMEN: Soft, non-tender, non-distended MUSCULOSKELETAL:  No edema; No deformity  SKIN: Warm and dry NEUROLOGIC:  Alert and oriented x 3 PSYCHIATRIC:  Normal affect   ASSESSMENT:    1. Dyspnea on exertion   2. Stable angina pectoris ([place])    PLAN:    In order of problems listed above:  Abnormal ZIO monitor -She feels occasional fluttering. -She has not had any higher symptoms such as syncope.   -I was able to personally review the monitor results and she did have a 5.7 second pause at 7:02 PM that was preceded by second-degree heart block type I.  I would be curious to know if she potentially had any vagal phenomenon surrounding this.  She also does have occasional episodes of supraventricular tachycardia as fast as 170 bpm as well as slower wide-complex tachycardia 109 bpm about 15 beats duration. -We are checking an echocardiogram as well as a stress test.  I would like to avoid AV nodal blocking agents because of pause. -I think would be a good idea for her to sit down with EP once echo and stress test has been performed.  Shortness of breath/ possible angina equivalent -With her strong family history of MI, we  will go ahead and check a pharmacologic stress test to ensure that she does not have any high risk features. -I will also check an echocardiogram to ensure proper structure and function.  Her shortness of breath could be an anginal equivalent.  She is a non-smoker never smoked   Medication Adjustments/Labs and Tests Ordered: Current medicines are reviewed at length with the patient today.  Concerns regarding medicines are outlined above.  Orders Placed This Encounter  Procedures   MYOCARDIAL PERFUSION IMAGING   EKG 12-Lead   ECHOCARDIOGRAM COMPLETE   No orders of the defined types were placed in this encounter.   Patient Instructions  Medication Instructions:  The current medical regimen is effective;  continue present plan and medications.  *If you need a refill on your cardiac medications before your next appointment, please call your pharmacy*  Testing/Procedures: Your physician has requested that you have an echocardiogram. Echocardiography is a painless test that uses sound waves to create images of your heart. It provides your doctor with information about the size and shape of your heart and how well your hearts chambers and valves are working. This procedure takes approximately one hour. There are no restrictions for this procedure.  Your physician has requested that you have a lexiscan myoview. For further information please visit [email or web address]. Please follow instruction sheet, as given.  Follow-Up: At [facility], you and your health needs are our priority.  As part of our continuing mission to provide you with exceptional heart care, we have created designated Provider Care Teams.  These Care Teams include your primary Cardiologist (physician) and Advanced Practice Providers (APPs -  Physician Assistants and Nurse Practitioners) who all work together to provide you with the care you need, when you need it.  We recommend signing up for the patient portal called  "MyChart".  Sign up information is provided on this After Visit Summary.  MyChart is used to connect with patients for Virtual Visits (Telemedicine).  Patients are able to view lab/test results, encounter notes, upcoming appointments, etc.  Non-urgent messages can be sent to your provider as well.   To learn more about what you can do with MyChart, go to [email or web address].    Your next appointment:   12 month(s)  The format for your next appointment:   In Person  Provider:   Candee Furbish, MD   Thank you for choosing [facility]!!         Signed, Candee Furbish, MD  10/21/2019 5:31 PM    [place]

## 2019-10-21 NOTE — Patient Instructions (Signed)
Medication Instructions:  The current medical regimen is effective;  continue present plan and medications.  *If you need a refill on your cardiac medications before your next appointment, please call your pharmacy*  Testing/Procedures: Your physician has requested that you have an echocardiogram. Echocardiography is a painless test that uses sound waves to create images of your heart. It provides your doctor with information about the size and shape of your heart and how well your heart's chambers and valves are working. This procedure takes approximately one hour. There are no restrictions for this procedure.  Your physician has requested that you have a lexiscan myoview. For further information please visit [email or web address]. Please follow instruction sheet, as given.  Follow-Up: At [facility], you and your health needs are our priority.  As part of our continuing mission to provide you with exceptional heart care, we have created designated Provider Care Teams.  These Care Teams include your primary Cardiologist (physician) and Advanced Practice Providers (APPs -  Physician Assistants and Nurse Practitioners) who all work together to provide you with the care you need, when you need it.  We recommend signing up for the patient portal called "MyChart".  Sign up information is provided on this After Visit Summary.  MyChart is used to connect with patients for Virtual Visits (Telemedicine).  Patients are able to view lab/test results, encounter notes, upcoming appointments, etc.  Non-urgent messages can be sent to your provider as well.   To learn more about what you can do with MyChart, go to [email or web address].    Your next appointment:   12 month(s)  The format for your next appointment:   In Person  Provider:   Candee Furbish, MD   Thank you for choosing [facility]!!

## 2019-10-25 NOTE — Addendum Note (Signed)
Addended by: Candee Furbish C on: 10/25/2019 03:50 PM   Modules accepted: Level of Service

## 2019-11-09 ENCOUNTER — Telehealth (HOSPITAL_COMMUNITY): Payer: Self-pay | Admitting: *Deleted

## 2019-11-09 NOTE — Telephone Encounter (Signed)
Patient given detailed instructions per Myocardial Perfusion Study Information Sheet for the test on 11/14/2019 at 1015. Patient notified to arrive 15 minutes early and that it is imperative to arrive on time for appointment to keep from having the test rescheduled.  If you need to cancel or reschedule your appointment, please call the office within 24 hours of your appointment. . Patient verbalized understanding.[name], [name] No mychart available.

## 2019-11-14 ENCOUNTER — Other Ambulatory Visit: Payer: Self-pay

## 2019-11-14 ENCOUNTER — Ambulatory Visit (HOSPITAL_COMMUNITY): Payer: PPO | Attending: Cardiovascular Disease

## 2019-11-14 ENCOUNTER — Ambulatory Visit (HOSPITAL_BASED_OUTPATIENT_CLINIC_OR_DEPARTMENT_OTHER): Payer: PPO

## 2019-11-14 DIAGNOSIS — R0609 Other forms of dyspnea: Secondary | ICD-10-CM

## 2019-11-14 DIAGNOSIS — R0600 Dyspnea, unspecified: Secondary | ICD-10-CM

## 2019-11-14 DIAGNOSIS — I208 Other forms of angina pectoris: Secondary | ICD-10-CM

## 2019-11-14 LAB — MYOCARDIAL PERFUSION IMAGING
LV dias vol: 49 mL (ref 46–106)
LV sys vol: 6 mL
Peak HR: 97 {beats}/min
Rest HR: 75 {beats}/min
SDS: 1
SRS: 0
SSS: 1
TID: 1.01

## 2019-11-14 MED ORDER — REGADENOSON 0.4 MG/5ML IV SOLN
0.4000 mg | Freq: Once | INTRAVENOUS | Status: AC
  Administered 2019-11-14: 0.4 mg via INTRAVENOUS

## 2019-11-14 MED ORDER — TECHNETIUM TC 99M TETROFOSMIN IV KIT
11.0000 | PACK | Freq: Once | INTRAVENOUS | Status: AC | PRN
  Administered 2019-11-14: 11 via INTRAVENOUS
  Filled 2019-11-14: qty 11

## 2019-11-14 MED ORDER — TECHNETIUM TC 99M TETROFOSMIN IV KIT
31.5000 | PACK | Freq: Once | INTRAVENOUS | Status: AC | PRN
  Administered 2019-11-14: 31.5 via INTRAVENOUS
  Filled 2019-11-14: qty 32

## 2019-11-16 ENCOUNTER — Telehealth: Payer: Self-pay | Admitting: Cardiology

## 2019-11-16 NOTE — Telephone Encounter (Signed)
Pt would like to know the results of her recent tests. Please call her cell phone if you are not able to reach her on her home phone

## 2019-11-28 ENCOUNTER — Other Ambulatory Visit: Payer: Self-pay | Admitting: *Deleted

## 2019-11-28 DIAGNOSIS — I471 Supraventricular tachycardia: Secondary | ICD-10-CM

## 2019-11-28 DIAGNOSIS — R002 Palpitations: Secondary | ICD-10-CM

## 2019-11-29 NOTE — Telephone Encounter (Signed)
I have had the chance to review the monitor results personally during the visit however I do not see them in the media folder.   Since she is still having the symptoms, please set her up to see EP. Please make sure that EP also has a faxed copy of ZIO monitor results that were ordered by Dr. Laurann [place].  Candee Furbish, MD   Pt was made aware of results and EP referral.  She has been scheduled for EP appt and is aware of date and time. (per Gracy Bruins)

## 2019-11-30 DIAGNOSIS — N390 Urinary tract infection, site not specified: Secondary | ICD-10-CM | POA: Diagnosis not present

## 2019-12-16 ENCOUNTER — Ambulatory Visit: Payer: PPO | Admitting: Internal Medicine

## 2019-12-16 ENCOUNTER — Other Ambulatory Visit (HOSPITAL_COMMUNITY)
Admission: RE | Admit: 2019-12-16 | Discharge: 2019-12-16 | Disposition: A | Discharge status: Home / Self Care | Payer: PPO | Source: Ambulatory Visit | Attending: Internal Medicine | Admitting: Internal Medicine

## 2019-12-16 ENCOUNTER — Encounter: Payer: Self-pay | Admitting: *Deleted

## 2019-12-16 ENCOUNTER — Other Ambulatory Visit: Payer: Self-pay

## 2019-12-16 ENCOUNTER — Encounter: Payer: Self-pay | Admitting: Internal Medicine

## 2019-12-16 VITALS — BP 138/68 | HR 78 | Ht 64.0 in | Wt 174.0 lb

## 2019-12-16 DIAGNOSIS — I495 Sick sinus syndrome: Secondary | ICD-10-CM | POA: Insufficient documentation

## 2019-12-16 DIAGNOSIS — Z01818 Encounter for other preprocedural examination: Secondary | ICD-10-CM

## 2019-12-16 DIAGNOSIS — Z01810 Encounter for preprocedural cardiovascular examination: Secondary | ICD-10-CM | POA: Diagnosis not present

## 2019-12-16 LAB — CBC
HCT: 39.9 % (ref 36.0–46.0)
Hemoglobin: 13.3 g/dL (ref 12.0–15.0)
MCH: 29.6 pg (ref 26.0–34.0)
MCHC: 33.3 g/dL (ref 30.0–36.0)
MCV: 88.9 fL (ref 80.0–100.0)
Platelets: 378 10*3/uL (ref 150–400)
RBC: 4.49 MIL/uL (ref 3.87–5.11)
RDW: 12.4 % (ref 11.5–15.5)
WBC: 7.4 10*3/uL (ref 4.0–10.5)
nRBC: 0 % (ref 0.0–0.2)

## 2019-12-16 LAB — BASIC METABOLIC PANEL
Anion gap: 9 (ref 5–15)
BUN: 17 mg/dL (ref 8–23)
CO2: 25 mmol/L (ref 22–32)
Calcium: 9.1 mg/dL (ref 8.9–10.3)
Chloride: 93 mmol/L — ABNORMAL LOW (ref 98–111)
Creatinine, Ser: 0.85 mg/dL (ref 0.44–1.00)
GFR calc Af Amer: >60 mL/min (ref >60)
GFR calc non Af Amer: >60 mL/min (ref >60)
Glucose, Bld: 104 mg/dL — ABNORMAL HIGH (ref 70–99)
Potassium: 3.4 mmol/L — ABNORMAL LOW (ref 3.5–5.1)
Sodium: 127 mmol/L — ABNORMAL LOW (ref 135–145)

## 2019-12-16 NOTE — H&P (View-Only) (Signed)
HPI [name] is referred by Dr. Marlou Porch for evaluation of symptomatic bradycardia due to sinus node dysfunction. She is  pleasant 84 yo woman who has had a several month h/o near syncopal spells. She has not passed out but has nearly so. She will experience sudden onset of transient vision loss and the sensation that she is about to go out. She will then go back to her normal. She also has palpitations. She wore a cardiac monitor which demonstrated pauses of 5 seconds and rapid heart beating, transiently. She has not been taking any AV nodal blocking drugs.  Allergies  Allergen Reactions  . Amoxicillin Diarrhea    Per patient severe diarrhea required hospilization     Current Outpatient Medications  Medication Sig Dispense Refill  . KLOR-CON M20 20 MEQ tablet Take 20 mEq by mouth daily.     Marland Kitchen triamterene-hydrochlorothiazide (MAXZIDE-25) 37.5-25 MG tablet Take 1 tablet by mouth every morning.     No current facility-administered medications for this visit.     Past Medical History:  Diagnosis Date  . Ankle tendinitis   . Breast cancer ([place])   . Breast cancer, left breast ([place]) 08/09/2012  . Gall stones   . Hip bursitis   . Hypertension   . Menorrhagia   . Migraine   . Osteopenia 03/27/2014  . Osteoporosis   . Personal history of radiation therapy 2012   Left Breast Cancer  . Plantar fasciitis   . RLS (restless legs syndrome)   . Shortness of breath    once a year- gets checked by Dr.  . [name] incontinence   . Trigger finger    CTS    ROS:   All systems reviewed and negative except as noted in the HPI.   Past Surgical History:  Procedure Laterality Date  . ABDOMINAL HYSTERECTOMY     Ovaries retained  . BLADDER SURGERY     Bladder Tack  . BREAST LUMPECTOMY Left 2012  . BREAST MASS EXCISION     Benign lump removal  . CARPAL TUNNEL RELEASE Right 01/11/2013   Procedure: CARPAL TUNNEL RELEASE, RELEASE A-1 PULLEY RIGHT INDEX FINGER;  Surgeon: Cammie Sickle., MD;  Location: [facility];  Service: Orthopedics;  Laterality: Right;  . CHOLECYSTECTOMY    . SHOULDER SURGERY Bilateral      Family History  Problem Relation Age of Onset  . Heart Problems Mother   . Hypertension Mother   . Diabetes Father   . Cancer Sister        Lung and Ovarian  . Diabetes Sister   . Heart Problems Brother   . Diabetes Brother   . Diabetes Sister   . Diabetes Sister   . Breast cancer Maternal Grandmother      Social History   Socioeconomic History  . Marital status: Married    Spouse name: Not on file  . Number of children: Not on file  . Years of education: Not on file  . Highest education level: Not on file  Occupational History  . Not on file  Tobacco Use  . Smoking status: Never Smoker  . Smokeless tobacco: Never Used  Substance and Sexual Activity  . Alcohol use: No  . Drug use: No  . Sexual activity: Never    Birth control/protection: Post-menopausal  Other Topics Concern  . Not on file  Social History Narrative  . Not on file   Social Determinants of Health   Financial  Resource Strain:   . Difficulty of Paying Living Expenses:   Food Insecurity:   . Worried About Charity fundraiser in the Last Year:   . Arboriculturist in the Last Year:   Transportation Needs:   . Film/video editor (Medical):   Marland Kitchen Lack of Transportation (Non-Medical):   Physical Activity:   . Days of Exercise per Week:   . Minutes of Exercise per Session:   Stress:   . Feeling of Stress :   Social Connections:   . Frequency of Communication with Friends and Family:   . Frequency of Social Gatherings with Friends and Family:   . Attends Religious Services:   . Active Member of Clubs or Organizations:   . Attends Archivist Meetings:   Marland Kitchen Marital Status:   Intimate Partner Violence:   . Fear of Current or Ex-Partner:   . Emotionally Abused:   Marland Kitchen Physically Abused:   . Sexually Abused:      BP (!) 138/68   Pulse 78    Ht 5\' 4"  (1.626 m)   Wt 174 lb (78.9 kg)   SpO2 98%   BMI 29.87 kg/m   Physical Exam: 84 yo woman, Well appearing NAD HEENT: Unremarkable Neck:  No JVD, no thyromegally Lymphatics:  No adenopathy Back:  No CVA tenderness Lungs:  Clear with no wheezes HEART:  Regular rate rhythm, no murmurs, no rubs, no clicks Abd:  soft, positive bowel sounds, no organomegally, no rebound, no guarding Ext:  2 plus pulses, no edema, no cyanosis, no clubbing Skin:  No rashes no nodules Neuro:  CN II through XII intact, motor grossly intact  EKG - reviewed. NSR with first degree AV block   Assess/Plan: 1. Symptomatic tachy-brady syndrome - I have discussed with the patient the indications/risks/beneftis/goals/expectations of PPM insertion and possible need for anti-arrhythmic drugs and she wishes to proceed.  2. SVT - she has worn a cardiac monitor but her SVT is not sustained. No indication for ablation at this point.   Mikle Bosworth.D.

## 2019-12-16 NOTE — Progress Notes (Signed)
HPI [name] is referred by Dr. Marlou Porch for evaluation of symptomatic bradycardia due to sinus node dysfunction. She is  pleasant 84 yo woman who has had a several month h/o near syncopal spells. She has not passed out but has nearly so. She will experience sudden onset of transient vision loss and the sensation that she is about to go out. She will then go back to her normal. She also has palpitations. She wore a cardiac monitor which demonstrated pauses of 5 seconds and rapid heart beating, transiently. She has not been taking any AV nodal blocking drugs.  Allergies  Allergen Reactions  . Amoxicillin Diarrhea    Per patient severe diarrhea required hospilization     Current Outpatient Medications  Medication Sig Dispense Refill  . KLOR-CON M20 20 MEQ tablet Take 20 mEq by mouth daily.     Marland Kitchen triamterene-hydrochlorothiazide (MAXZIDE-25) 37.5-25 MG tablet Take 1 tablet by mouth every morning.     No current facility-administered medications for this visit.     Past Medical History:  Diagnosis Date  . Ankle tendinitis   . Breast cancer ([place])   . Breast cancer, left breast ([place]) 08/09/2012  . Gall stones   . Hip bursitis   . Hypertension   . Menorrhagia   . Migraine   . Osteopenia 03/27/2014  . Osteoporosis   . Personal history of radiation therapy 2012   Left Breast Cancer  . Plantar fasciitis   . RLS (restless legs syndrome)   . Shortness of breath    once a year- gets checked by Dr.  . [name] incontinence   . Trigger finger    CTS    ROS:   All systems reviewed and negative except as noted in the HPI.   Past Surgical History:  Procedure Laterality Date  . ABDOMINAL HYSTERECTOMY     Ovaries retained  . BLADDER SURGERY     Bladder Tack  . BREAST LUMPECTOMY Left 2012  . BREAST MASS EXCISION     Benign lump removal  . CARPAL TUNNEL RELEASE Right 01/11/2013   Procedure: CARPAL TUNNEL RELEASE, RELEASE A-1 PULLEY RIGHT INDEX FINGER;  Surgeon: Cammie Sickle., MD;  Location: [facility];  Service: Orthopedics;  Laterality: Right;  . CHOLECYSTECTOMY    . SHOULDER SURGERY Bilateral      Family History  Problem Relation Age of Onset  . Heart Problems Mother   . Hypertension Mother   . Diabetes Father   . Cancer Sister        Lung and Ovarian  . Diabetes Sister   . Heart Problems Brother   . Diabetes Brother   . Diabetes Sister   . Diabetes Sister   . Breast cancer Maternal Grandmother      Social History   Socioeconomic History  . Marital status: Married    Spouse name: Not on file  . Number of children: Not on file  . Years of education: Not on file  . Highest education level: Not on file  Occupational History  . Not on file  Tobacco Use  . Smoking status: Never Smoker  . Smokeless tobacco: Never Used  Substance and Sexual Activity  . Alcohol use: No  . Drug use: No  . Sexual activity: Never    Birth control/protection: Post-menopausal  Other Topics Concern  . Not on file  Social History Narrative  . Not on file   Social Determinants of Health   Financial  Resource Strain:   . Difficulty of Paying Living Expenses:   Food Insecurity:   . Worried About Charity fundraiser in the Last Year:   . Arboriculturist in the Last Year:   Transportation Needs:   . Film/video editor (Medical):   Marland Kitchen Lack of Transportation (Non-Medical):   Physical Activity:   . Days of Exercise per Week:   . Minutes of Exercise per Session:   Stress:   . Feeling of Stress :   Social Connections:   . Frequency of Communication with Friends and Family:   . Frequency of Social Gatherings with Friends and Family:   . Attends Religious Services:   . Active Member of Clubs or Organizations:   . Attends Archivist Meetings:   Marland Kitchen Marital Status:   Intimate Partner Violence:   . Fear of Current or Ex-Partner:   . Emotionally Abused:   Marland Kitchen Physically Abused:   . Sexually Abused:      BP (!) 138/68   Pulse 78    Ht 5\' 4"  (1.626 m)   Wt 174 lb (78.9 kg)   SpO2 98%   BMI 29.87 kg/m   Physical Exam: 84 yo woman, Well appearing NAD HEENT: Unremarkable Neck:  No JVD, no thyromegally Lymphatics:  No adenopathy Back:  No CVA tenderness Lungs:  Clear with no wheezes HEART:  Regular rate rhythm, no murmurs, no rubs, no clicks Abd:  soft, positive bowel sounds, no organomegally, no rebound, no guarding Ext:  2 plus pulses, no edema, no cyanosis, no clubbing Skin:  No rashes no nodules Neuro:  CN II through XII intact, motor grossly intact  EKG - reviewed. NSR with first degree AV block   Assess/Plan: 1. Symptomatic tachy-brady syndrome - I have discussed with the patient the indications/risks/beneftis/goals/expectations of PPM insertion and possible need for anti-arrhythmic drugs and she wishes to proceed.  2. SVT - she has worn a cardiac monitor but her SVT is not sustained. No indication for ablation at this point.   Mikle Bosworth.D.

## 2019-12-16 NOTE — Patient Instructions (Addendum)
Medication Instructions:  Your physician recommends that you continue on your current medications as directed. Please refer to the Current Medication list given to you today.  *If you need a refill on your cardiac medications before your next appointment, please call your pharmacy*   Lab Work: Your physician recommends that you return for lab work in: Just before procedure.   If you have labs (blood work) drawn today and your tests are completely normal, you will receive your results only by: Marland Kitchen MyChart Message (if you have MyChart) OR . A paper copy in the mail If you have any lab test that is abnormal or we need to change your treatment, we will call you to review the results.   Testing/Procedures: Your physician has recommended that you have a pacemaker inserted. A pacemaker is a small device that is placed under the skin of your chest or abdomen to help control abnormal heart rhythms. This device uses electrical pulses to prompt the heart to beat at a normal rate. Pacemakers are used to treat heart rhythms that are too slow. Wire (leads) are attached to the pacemaker that goes into the chambers of you heart. This is done in the hospital and usually requires and overnight stay. Please see the instruction sheet given to you today for more information. Aug 5, Aug 9, Aug 10, Aug 16, Aug 19     Follow-Up: At [facility], you and your health needs are our priority.  As part of our continuing mission to provide you with exceptional heart care, we have created designated Provider Care Teams.  These Care Teams include your primary Cardiologist (physician) and Advanced Practice Providers (APPs -  Physician Assistants and Nurse Practitioners) who all work together to provide you with the care you need, when you need it.  We recommend signing up for the patient portal called "MyChart".  Sign up information is provided on this After Visit Summary.  MyChart is used to connect with patients for  Virtual Visits (Telemedicine).  Patients are able to view lab/test results, encounter notes, upcoming appointments, etc.  Non-urgent messages can be sent to your provider as well.   To learn more about what you can do with MyChart, go to [email or web address].    Your next appointment:    91 Day post pacemaker   The format for your next appointment:   In Person  Provider:   [name], MD   Other Instructions Thank you for choosing [facility]! ]

## 2019-12-19 ENCOUNTER — Other Ambulatory Visit (HOSPITAL_COMMUNITY)
Admission: RE | Admit: 2019-12-19 | Discharge: 2019-12-19 | Disposition: A | Discharge status: Home / Self Care | Payer: PPO | Source: Ambulatory Visit | Attending: Internal Medicine | Admitting: Internal Medicine

## 2019-12-19 DIAGNOSIS — Z20822 Contact with and (suspected) exposure to covid-19: Secondary | ICD-10-CM | POA: Insufficient documentation

## 2019-12-19 DIAGNOSIS — Z01812 Encounter for preprocedural laboratory examination: Secondary | ICD-10-CM | POA: Diagnosis not present

## 2019-12-19 LAB — SARS CORONAVIRUS 2 (TAT 6-24 HRS): SARS Coronavirus 2: NEGATIVE

## 2019-12-22 ENCOUNTER — Ambulatory Visit (HOSPITAL_COMMUNITY): Payer: PPO

## 2019-12-22 ENCOUNTER — Other Ambulatory Visit: Payer: Self-pay

## 2019-12-22 ENCOUNTER — Ambulatory Visit (HOSPITAL_COMMUNITY)
Admission: RE | Disposition: A | Discharge status: Home / Self Care | Payer: Self-pay | Source: Home / Self Care | Attending: Internal Medicine

## 2019-12-22 ENCOUNTER — Ambulatory Visit (HOSPITAL_COMMUNITY)
Admission: RE | Admit: 2019-12-22 | Discharge: 2019-12-22 | Disposition: A | Discharge status: Home / Self Care | Payer: PPO | Attending: Internal Medicine | Admitting: Internal Medicine

## 2019-12-22 DIAGNOSIS — I495 Sick sinus syndrome: Secondary | ICD-10-CM | POA: Insufficient documentation

## 2019-12-22 DIAGNOSIS — Z803 Family history of malignant neoplasm of breast: Secondary | ICD-10-CM | POA: Insufficient documentation

## 2019-12-22 DIAGNOSIS — M810 Age-related osteoporosis without current pathological fracture: Secondary | ICD-10-CM | POA: Diagnosis not present

## 2019-12-22 DIAGNOSIS — Z801 Family history of malignant neoplasm of trachea, bronchus and lung: Secondary | ICD-10-CM | POA: Diagnosis not present

## 2019-12-22 DIAGNOSIS — Z8041 Family history of malignant neoplasm of ovary: Secondary | ICD-10-CM | POA: Diagnosis not present

## 2019-12-22 DIAGNOSIS — Z8249 Family history of ischemic heart disease and other diseases of the circulatory system: Secondary | ICD-10-CM | POA: Insufficient documentation

## 2019-12-22 DIAGNOSIS — I10 Essential (primary) hypertension: Secondary | ICD-10-CM | POA: Diagnosis not present

## 2019-12-22 DIAGNOSIS — Z880 Allergy status to penicillin: Secondary | ICD-10-CM | POA: Insufficient documentation

## 2019-12-22 DIAGNOSIS — Z950 Presence of cardiac pacemaker: Secondary | ICD-10-CM

## 2019-12-22 DIAGNOSIS — Z853 Personal history of malignant neoplasm of breast: Secondary | ICD-10-CM | POA: Diagnosis not present

## 2019-12-22 DIAGNOSIS — M8580 Other specified disorders of bone density and structure, unspecified site: Secondary | ICD-10-CM | POA: Diagnosis not present

## 2019-12-22 DIAGNOSIS — Z79899 Other long term (current) drug therapy: Secondary | ICD-10-CM | POA: Insufficient documentation

## 2019-12-22 DIAGNOSIS — Z01818 Encounter for other preprocedural examination: Secondary | ICD-10-CM | POA: Diagnosis not present

## 2019-12-22 DIAGNOSIS — G2581 Restless legs syndrome: Secondary | ICD-10-CM | POA: Insufficient documentation

## 2019-12-22 DIAGNOSIS — J811 Chronic pulmonary edema: Secondary | ICD-10-CM | POA: Diagnosis not present

## 2019-12-22 DIAGNOSIS — Z90710 Acquired absence of both cervix and uterus: Secondary | ICD-10-CM | POA: Diagnosis not present

## 2019-12-22 HISTORY — PX: PACEMAKER IMPLANT: EP1218

## 2019-12-22 SURGERY — PACEMAKER IMPLANT

## 2019-12-22 MED ORDER — ONDANSETRON HCL 4 MG/2ML IJ SOLN: 4.0000 mg | Freq: Four times a day (QID) | INTRAMUSCULAR | Status: DC | PRN

## 2019-12-22 MED ORDER — SODIUM CHLORIDE 0.9% FLUSH: 3.0000 mL | INTRAVENOUS | Status: DC | PRN

## 2019-12-22 MED ORDER — LIDOCAINE HCL (PF) 1 % IJ SOLN
INTRAMUSCULAR | Status: AC
  Filled 2019-12-22: qty 60

## 2019-12-22 MED ORDER — ACETAMINOPHEN 325 MG PO TABS
325.0000 mg | ORAL_TABLET | ORAL | Status: DC | PRN
  Filled 2019-12-22: qty 2

## 2019-12-22 MED ORDER — FENTANYL CITRATE (PF) 100 MCG/2ML IJ SOLN
INTRAMUSCULAR | Status: AC
  Filled 2019-12-22: qty 2

## 2019-12-22 MED ORDER — MIDAZOLAM HCL 5 MG/5ML IJ SOLN
INTRAMUSCULAR | Status: DC | PRN
  Administered 2019-12-22 (×4): 1 mg via INTRAVENOUS

## 2019-12-22 MED ORDER — VANCOMYCIN HCL IN DEXTROSE 1-5 GM/200ML-% IV SOLN
1000.0000 mg | INTRAVENOUS | Status: AC
  Administered 2019-12-22: 1000 mg via INTRAVENOUS

## 2019-12-22 MED ORDER — IOHEXOL 350 MG/ML SOLN
INTRAVENOUS | Status: DC | PRN
  Administered 2019-12-22: 10 mL

## 2019-12-22 MED ORDER — HEPARIN (PORCINE) IN NACL 1000-0.9 UT/500ML-% IV SOLN
INTRAVENOUS | Status: AC
  Filled 2019-12-22: qty 500

## 2019-12-22 MED ORDER — DIPHENHYDRAMINE HCL 50 MG/ML IJ SOLN
INTRAMUSCULAR | Status: DC | PRN
  Administered 2019-12-22: 25 mg via INTRAVENOUS

## 2019-12-22 MED ORDER — SODIUM CHLORIDE 0.9 % IV SOLN
INTRAVENOUS | Status: AC
  Filled 2019-12-22: qty 2

## 2019-12-22 MED ORDER — METHYLPREDNISOLONE SODIUM SUCC 125 MG IJ SOLR
INTRAMUSCULAR | Status: AC
  Filled 2019-12-22: qty 2

## 2019-12-22 MED ORDER — VANCOMYCIN HCL IN DEXTROSE 1-5 GM/200ML-% IV SOLN
INTRAVENOUS | Status: AC
  Filled 2019-12-22: qty 200

## 2019-12-22 MED ORDER — DIPHENHYDRAMINE HCL 50 MG/ML IJ SOLN
INTRAMUSCULAR | Status: AC
  Filled 2019-12-22: qty 1

## 2019-12-22 MED ORDER — METOPROLOL TARTRATE 5 MG/5ML IV SOLN
INTRAVENOUS | Status: AC
  Filled 2019-12-22: qty 5

## 2019-12-22 MED ORDER — METOPROLOL TARTRATE 5 MG/5ML IV SOLN
INTRAVENOUS | Status: DC | PRN
  Administered 2019-12-22: 5 mg via INTRAVENOUS

## 2019-12-22 MED ORDER — SODIUM CHLORIDE 0.9 % IV SOLN: INTRAVENOUS | Status: DC

## 2019-12-22 MED ORDER — SODIUM CHLORIDE 0.9 % IV SOLN: 250.0000 mL | INTRAVENOUS | Status: DC

## 2019-12-22 MED ORDER — SODIUM CHLORIDE 0.9% FLUSH: 3.0000 mL | Freq: Two times a day (BID) | INTRAVENOUS | Status: DC

## 2019-12-22 MED ORDER — LIDOCAINE HCL (PF) 1 % IJ SOLN
INTRAMUSCULAR | Status: DC | PRN
  Administered 2019-12-22: 60 mL

## 2019-12-22 MED ORDER — MIDAZOLAM HCL 5 MG/5ML IJ SOLN
INTRAMUSCULAR | Status: AC
  Filled 2019-12-22: qty 5

## 2019-12-22 MED ORDER — FENTANYL CITRATE (PF) 100 MCG/2ML IJ SOLN
INTRAMUSCULAR | Status: DC | PRN
  Administered 2019-12-22 (×4): 12.5 ug via INTRAVENOUS

## 2019-12-22 MED ORDER — METHYLPREDNISOLONE SODIUM SUCC 125 MG IJ SOLR
INTRAMUSCULAR | Status: DC | PRN
  Administered 2019-12-22: 62.5 mg via INTRAVENOUS

## 2019-12-22 MED ORDER — HEPARIN (PORCINE) IN NACL 1000-0.9 UT/500ML-% IV SOLN
INTRAVENOUS | Status: DC | PRN
  Administered 2019-12-22: 500 mL

## 2019-12-22 MED ORDER — CHLORHEXIDINE GLUCONATE 4 % EX LIQD
4.0000 "application " | Freq: Once | CUTANEOUS | Status: DC
  Filled 2019-12-22: qty 60

## 2019-12-22 MED ORDER — SODIUM CHLORIDE 0.9 % IV SOLN: 80.0000 mg | INTRAVENOUS | Status: DC

## 2019-12-22 SURGICAL SUPPLY — 10 items
CABLE SURGICAL S-101-97-12 (CABLE) ×3 IMPLANT
CATH SELECT PACE 669183 (CATHETERS) ×3 IMPLANT
CUTTER LV DELIVERY CATHETER 7 (MISCELLANEOUS) ×3 IMPLANT
LEAD INGEVITY 7841 52 (Lead) ×3 IMPLANT
LEAD INGEVITY 7842 59 (Lead) ×3 IMPLANT
PACEMAKER ACCOLADE DR-EL (Pacemaker) ×3 IMPLANT
PAD PRO RADIOLUCENT 2001M-C (PAD) ×3 IMPLANT
SHEATH 7FR PRELUDE SNAP 13 (SHEATH) ×6 IMPLANT
TRAY PACEMAKER INSERTION (PACKS) ×3 IMPLANT
WIRE HI TORQ VERSACORE-J 145CM (WIRE) ×3 IMPLANT

## 2019-12-22 NOTE — Discharge Instructions (Signed)
After Your Pacemaker    You have a [employer]  Pacemaker  ACTIVITY  Do not lift your arm above shoulder height for 1 week after your procedure. After 7 days, you may progress as below.     Thursday December 29, 2019  Friday December 30, 2019 Saturday December 31, 2019 Sunday January 01, 2020    Do not lift, push, pull, or carry anything over 10 pounds with the affected arm until 6 weeks (Thursday February 02, 2020 ) after your procedure.     Do NOT DRIVE until you have been seen for your wound check, or as long as instructed by your healthcare provider.    Ask your healthcare provider when you can go back to work   INCISION/Dressing     Monitor your Pacemaker site for redness, swelling, and drainage. Call the device clinic at [phone] if you experience these symptoms or fever/chills.   Keep incision site dry until your wound check.  Remove outer dressing in 24 hours. Do not remove steri-strips they will fall off on their own.  Tomorrow, 12/23/2019 remove the arm sling  Tomorrow 12/23/2019 remove the outer plastic dressing.  Underneath there are steri strips covering the wound, DO NOT remove these   Avoid lotions, ointments, or perfumes over your incision until it is well-healed.   You may use a hot tub or a pool AFTER your wound check appointment if the incision is completely closed.   PAcemaker Alerts:  Some alerts are vibratory and others beep. These are NOT emergencies. Please call our office to let us know. If this occurs at night or on weekends, it can wait until the next business day. Send a remote transmission.   If your device is capable of reading fluid status (for heart failure), you will be offered monthly monitoring to review this with you.   DEVICE MANAGEMENT  Remote monitoring is used to monitor your pacemaker from home. This monitoring is scheduled every 91 days by our office. It allows [place] to keep an eye on the functioning of your device to ensure  it is working properly. You will routinely see your Electrophysiologist annually (more often if necessary).    You should receive your ID card for your new device in 4-8 weeks. Keep this card with you at all times once received. Consider wearing a medical alert bracelet or necklace.   Your Pacemaker may be MRI compatible. This will be discussed at your next office visit/wound check.  You should avoid contact with strong electric or magnetic fields.    Do not use amateur (ham) radio equipment or electric (arc) welding torches. MP3 player headphones with magnets should not be used. Some devices are safe to use if held at least 12 inches (30 cm) from your Pacemaker. These include power tools, lawn mowers, and speakers. If you are unsure if something is safe to use, ask your health care provider.   When using your cell phone, hold it to the ear that is on the opposite side from the Pacemaker. Do not leave your cell phone in a pocket over the Pacemaker.   You may safely use electric blankets, heating pads, computers, and microwave ovens.  Call the office right away if:  You have chest pain.  You feel more short of breath than you have felt before.  You feel more light-headed than you have felt before.  Your incision starts to open up.  This information is not intended to replace advice given to you  by your health care provider. Make sure you discuss any questions you have with your health care provider.

## 2019-12-22 NOTE — Interval H&P Note (Signed)
History and Physical Interval Note:  12/22/2019 10:31 AM  [name]  has presented today for surgery, with the diagnosis of sinus node disfunction.  The various methods of treatment have been discussed with the patient and family. After consideration of risks, benefits and other options for treatment, the patient has consented to  Procedure(s): PACEMAKER IMPLANT (N/A) as a surgical intervention.  The patient's history has been reviewed, patient examined, no change in status, stable for surgery.  I have reviewed the patient's chart and labs.  Questions were answered to the patient's satisfaction.     [name]

## 2019-12-23 ENCOUNTER — Encounter (HOSPITAL_COMMUNITY): Payer: Self-pay | Admitting: Internal Medicine

## 2019-12-23 MED FILL — Gentamicin Sulfate Inj 40 MG/ML: INTRAMUSCULAR | Qty: 80 | Status: AC

## 2019-12-27 ENCOUNTER — Telehealth: Payer: Self-pay | Admitting: Internal Medicine

## 2019-12-27 NOTE — Telephone Encounter (Signed)
Spoke with pt daughter.  She reports on Sunday pt had an episode occurring in the early evening, she was standing when she suddenly felt as though she might pass out.  Daughter was with her at the time, states she helped her to a chair.  She denies syncope.  On Monday pt had an episode of nausea followed by trembling legs and reporting her heart was fluttering.  No recent transmissions have been received.    Daughter also reports pt continues to c/o of SOB although she feels that this has improved some since PM implant  Pt confirmed compliance with Maxzide 37.5-25mg  daily and Potassium 20 meq daily.  She does not monitor her BP on a regular basis.    BSX PM implanted on 12/22/19 due to symptomatic bradycardia/ sinus node dysfunction. Pt indicated there was some concern about AF occurring at implant. Assisted pt with sending manual transmission.    Transmission reviewed, Presenting HR- [id number].  No arrythmias recorded.  Pt has had some PVCs and PACs noted which might explain the fluttering feelings she had.  Nothing noted to explain her other symptoms.    Advised pt and her daughter [name] of the findings in the transmission.  Pt will start checking her BP especially if symptomatic.  Informed them I will forward info over to Dr. Forde Dandy nurse.

## 2019-12-27 NOTE — Telephone Encounter (Signed)
   Pt's daughter calling, she said after pt's procedure pt have symptoms, she went Afib, pt is SOB, trembling of her legs and arms, and on Sunday night she almost pass out and also fluttering.

## 2019-12-27 NOTE — Telephone Encounter (Signed)
Returned call to Pt's daughter.  Asked to have Pt check her BP when she feels dizzy/faint.  Need to determine that she is not hypotensive during these episodes.  Pt with known palpitations.    Pt's daughter will bring BP readings to wound check appointment.  Will discuss with Dr. Lovena Le at that time.

## 2019-12-29 ENCOUNTER — Telehealth: Payer: Self-pay | Admitting: Internal Medicine

## 2019-12-29 NOTE — Telephone Encounter (Signed)
New message:     Patient states some one called her, I looked and did not see a message. Please call patient back.

## 2019-12-29 NOTE — Telephone Encounter (Signed)
Pt calling back.  Received reminder call today for appt next Tuesday.  Pt c/o some bilateral lower extremity edema.  Advised to reduce salt, elevate legs and get compression hose (if possible) If still has swelling at wound check with discuss with Dr. Lovena Le

## 2019-12-30 ENCOUNTER — Encounter: Payer: Self-pay | Admitting: Genetic Counselor

## 2020-01-03 ENCOUNTER — Ambulatory Visit (INDEPENDENT_AMBULATORY_CARE_PROVIDER_SITE_OTHER): Payer: PPO | Admitting: Emergency Medicine

## 2020-01-03 ENCOUNTER — Other Ambulatory Visit: Payer: Self-pay

## 2020-01-03 DIAGNOSIS — Z950 Presence of cardiac pacemaker: Secondary | ICD-10-CM | POA: Diagnosis not present

## 2020-01-03 DIAGNOSIS — I495 Sick sinus syndrome: Secondary | ICD-10-CM | POA: Diagnosis not present

## 2020-01-03 DIAGNOSIS — R002 Palpitations: Secondary | ICD-10-CM | POA: Diagnosis not present

## 2020-01-03 DIAGNOSIS — R0609 Other forms of dyspnea: Secondary | ICD-10-CM

## 2020-01-03 MED ORDER — FUROSEMIDE 20 MG PO TABS: 20.0000 mg | ORAL_TABLET | Freq: Every day | ORAL | 3 refills | Status: DC

## 2020-01-03 MED ORDER — METOPROLOL SUCCINATE ER 25 MG PO TB24: 25.0000 mg | ORAL_TABLET | Freq: Every day | ORAL | 3 refills | Status: DC

## 2020-01-03 MED ORDER — FUROSEMIDE 20 MG PO TABS
20.0000 mg | ORAL_TABLET | Freq: Every day | ORAL | 3 refills | Status: DC
Start: 2020-01-03 — End: 2020-01-03

## 2020-01-03 MED ORDER — METOPROLOL SUCCINATE ER 25 MG PO TB24
25.0000 mg | ORAL_TABLET | Freq: Every day | ORAL | 3 refills | Status: DC
Start: 2020-01-03 — End: 2020-01-03

## 2020-01-11 ENCOUNTER — Other Ambulatory Visit (HOSPITAL_COMMUNITY)
Admission: RE | Admit: 2020-01-11 | Discharge: 2020-01-11 | Disposition: A | Discharge status: Home / Self Care | Payer: PPO | Source: Ambulatory Visit | Attending: Internal Medicine | Admitting: Internal Medicine

## 2020-01-11 DIAGNOSIS — R002 Palpitations: Secondary | ICD-10-CM | POA: Insufficient documentation

## 2020-01-11 DIAGNOSIS — R0600 Dyspnea, unspecified: Secondary | ICD-10-CM | POA: Diagnosis not present

## 2020-01-11 DIAGNOSIS — R0609 Other forms of dyspnea: Secondary | ICD-10-CM

## 2020-01-11 LAB — BASIC METABOLIC PANEL
Anion gap: 10 (ref 5–15)
BUN: 20 mg/dL (ref 8–23)
CO2: 25 mmol/L (ref 22–32)
Calcium: 8.6 mg/dL — ABNORMAL LOW (ref 8.9–10.3)
Chloride: 97 mmol/L — ABNORMAL LOW (ref 98–111)
Creatinine, Ser: 0.8 mg/dL (ref 0.44–1.00)
GFR calc Af Amer: >60 mL/min (ref >60)
GFR calc non Af Amer: >60 mL/min (ref >60)
Glucose, Bld: 133 mg/dL — ABNORMAL HIGH (ref 70–99)
Potassium: 3.8 mmol/L (ref 3.5–5.1)
Sodium: 132 mmol/L — ABNORMAL LOW (ref 135–145)

## 2020-01-11 LAB — CUP PACEART INCLINIC DEVICE CHECK
Brady Statistic RA Percent Paced: 5 %
Brady Statistic RV Percent Paced: 3 %
Date Time Interrogation Session: 20210817122800
Implantable Lead Implant Date: 20210805
Implantable Lead Implant Date: 20210805
Implantable Lead Location: 753859
Implantable Lead Location: 753860
Implantable Lead Model: 7841
Implantable Lead Model: 7842
Implantable Lead Serial Number: 1049950
Implantable Lead Serial Number: 1083642
Implantable Pulse Generator Implant Date: 20210805
Lead Channel Impedance Value: 594 Ohm
Lead Channel Impedance Value: 718 Ohm
Lead Channel Pacing Threshold Amplitude: 0.5 V
Lead Channel Pacing Threshold Amplitude: 0.7 V
Lead Channel Pacing Threshold Pulse Width: 0.4 ms
Lead Channel Pacing Threshold Pulse Width: 0.4 ms
Lead Channel Sensing Intrinsic Amplitude: 10.9 mV
Lead Channel Sensing Intrinsic Amplitude: 3.6 mV
Lead Channel Setting Pacing Amplitude: 3.5 V
Lead Channel Setting Pacing Amplitude: 3.5 V
Lead Channel Setting Pacing Pulse Width: 0.4 ms
Lead Channel Setting Sensing Sensitivity: 2.5 mV
Pulse Gen Serial Number: 942377

## 2020-01-11 NOTE — Progress Notes (Signed)
Wound check appointment. Steri-strips removed. Wound without redness or edema. Incision edges approximated, wound well healed. Normal device function. Thresholds, sensing, and impedances consistent with implant measurements. Device programmed at 3.5V/auto capture programmed on for extra safety margin until 3 month visit. Histogram distribution appropriate for patient and level of activity. No mode switches or high ventricular rates noted. Patient educated about wound care, arm mobility, lifting restrictions. ROV with 03/27/20. Enrolled in remote transmissions and next remote 03/23/20.

## 2020-02-14 DIAGNOSIS — H52203 Unspecified astigmatism, bilateral: Secondary | ICD-10-CM | POA: Diagnosis not present

## 2020-02-14 DIAGNOSIS — H25813 Combined forms of age-related cataract, bilateral: Secondary | ICD-10-CM | POA: Diagnosis not present

## 2020-02-14 DIAGNOSIS — H5213 Myopia, bilateral: Secondary | ICD-10-CM | POA: Diagnosis not present

## 2020-02-14 DIAGNOSIS — H524 Presbyopia: Secondary | ICD-10-CM | POA: Diagnosis not present

## 2020-03-15 DIAGNOSIS — Z23 Encounter for immunization: Secondary | ICD-10-CM | POA: Diagnosis not present

## 2020-03-23 ENCOUNTER — Ambulatory Visit (INDEPENDENT_AMBULATORY_CARE_PROVIDER_SITE_OTHER): Payer: PPO

## 2020-03-23 ENCOUNTER — Telehealth: Payer: Self-pay | Admitting: Emergency Medicine

## 2020-03-23 DIAGNOSIS — I495 Sick sinus syndrome: Secondary | ICD-10-CM | POA: Diagnosis not present

## 2020-03-23 LAB — CUP PACEART REMOTE DEVICE CHECK
Battery Remaining Longevity: 162 mo
Battery Remaining Percentage: 100 %
Brady Statistic RA Percent Paced: 40 %
Brady Statistic RV Percent Paced: 8 %
Date Time Interrogation Session: 20211105034100
Implantable Lead Implant Date: 20210805
Implantable Lead Implant Date: 20210805
Implantable Lead Location: 753859
Implantable Lead Location: 753860
Implantable Lead Model: 7841
Implantable Lead Model: 7842
Implantable Lead Serial Number: 1049950
Implantable Lead Serial Number: 1083642
Implantable Pulse Generator Implant Date: 20210805
Lead Channel Impedance Value: 549 Ohm
Lead Channel Impedance Value: 735 Ohm
Lead Channel Pacing Threshold Amplitude: 0.6 V
Lead Channel Pacing Threshold Amplitude: 0.6 V
Lead Channel Pacing Threshold Pulse Width: 0.4 ms
Lead Channel Pacing Threshold Pulse Width: 0.4 ms
Lead Channel Setting Pacing Amplitude: 3.5 V
Lead Channel Setting Pacing Amplitude: 3.5 V
Lead Channel Setting Pacing Pulse Width: 0.4 ms
Lead Channel Setting Sensing Sensitivity: 2.5 mV
Pulse Gen Serial Number: 942377

## 2020-03-23 NOTE — Telephone Encounter (Signed)
91 day remote received. Device function WNL episodes of AF with RVR with longest episode recorded at 33 seconds. Event log shows ATR alert that was 2 hours, 52 minutes in duration  with no EGM on 02/02/20. No recorded events since 02/02/20. No OAC. Patient has 91 day follow-up with Dr Lovena Le on 03/27/20.

## 2020-03-23 NOTE — Progress Notes (Signed)
Remote pacemaker transmission.   

## 2020-03-27 ENCOUNTER — Other Ambulatory Visit: Payer: Self-pay

## 2020-03-27 ENCOUNTER — Ambulatory Visit: Payer: PPO | Admitting: Internal Medicine

## 2020-03-27 ENCOUNTER — Encounter: Payer: Self-pay | Admitting: Internal Medicine

## 2020-03-27 DIAGNOSIS — Z950 Presence of cardiac pacemaker: Secondary | ICD-10-CM | POA: Insufficient documentation

## 2020-03-27 DIAGNOSIS — I480 Paroxysmal atrial fibrillation: Secondary | ICD-10-CM | POA: Diagnosis not present

## 2020-03-27 DIAGNOSIS — I495 Sick sinus syndrome: Secondary | ICD-10-CM | POA: Diagnosis not present

## 2020-03-27 MED ORDER — APIXABAN 5 MG PO TABS: 5.0000 mg | ORAL_TABLET | Freq: Two times a day (BID) | ORAL | 11 refills | Status: DC

## 2020-03-27 NOTE — Patient Instructions (Addendum)
Medication Instructions:  Your physician has recommended you make the following change in your medication:   1.  START taking Eliquis 5 mg- Take one tablet by mouth twice a day  Labwork: You will get lab work in one month:  April 24, 2020 any time between 8;00 am and 4:30 pm.  You do not need to be fasting.  Testing/Procedures: None ordered.  Follow-Up: Your physician wants you to follow-up in: one year with Dr. Lovena Le.   You will receive a reminder letter in the mail two months in advance. If you don't receive a letter, please call our office to schedule the follow-up appointment.  Remote monitoring is used to monitor your Pacemaker from home. This monitoring reduces the number of office visits required to check your device to one time per year. It allows [place] to keep an eye on the functioning of your device to ensure it is working properly. You are scheduled for a device check from home on 06/22/2020. You may send your transmission at any time that day. If you have a wireless device, the transmission will be sent automatically. After your physician reviews your transmission, you will receive a postcard with your next transmission date.  Any Other Special Instructions Will Be Listed Below (If Applicable).  If you need a refill on your cardiac medications before your next appointment, please call your pharmacy.   Apixaban oral tablets What is this medicine? APIXABAN (a PIX a ban) is an anticoagulant (blood thinner). It is used to lower the chance of stroke in people with a medical condition called atrial fibrillation. It is also used to treat or prevent blood clots in the lungs or in the veins. This medicine may be used for other purposes; ask your health care provider or pharmacist if you have questions. COMMON BRAND NAME(S): Eliquis What should I tell my health care provider before I take this medicine? They need to know if you have any of these conditions:  antiphospholipid antibody  syndrome  bleeding disorders  bleeding in the brain  blood in your stools (black or tarry stools) or if you have blood in your vomit  history of blood clots  history of stomach bleeding  kidney disease  liver disease  mechanical heart valve  an unusual or allergic reaction to apixaban, other medicines, foods, dyes, or preservatives  pregnant or trying to get pregnant  breast-feeding How should I use this medicine? Take this medicine by mouth with a glass of water. Follow the directions on the prescription label. You can take it with or without food. If it upsets your stomach, take it with food. Take your medicine at regular intervals. Do not take it more often than directed. Do not stop taking except on your doctor's advice. Stopping this medicine may increase your risk of a blood clot. Be sure to refill your prescription before you run out of medicine. Talk to your pediatrician regarding the use of this medicine in children. Special care may be needed. Overdosage: If you think you have taken too much of this medicine contact a poison control center or emergency room at once. NOTE: This medicine is only for you. Do not share this medicine with others. What if I miss a dose? If you miss a dose, take it as soon as you can. If it is almost time for your next dose, take only that dose. Do not take double or extra doses. What may interact with this medicine? This medicine may interact with the following:  aspirin and aspirin-like medicines  certain medicines for fungal infections like ketoconazole and itraconazole  certain medicines for seizures like carbamazepine and phenytoin  certain medicines that treat or prevent blood clots like warfarin, enoxaparin, and dalteparin  clarithromycin  NSAIDs, medicines for pain and inflammation, like ibuprofen or naproxen  rifampin  ritonavir  St. John's wort This list may not describe all possible interactions. Give your health care  provider a list of all the medicines, herbs, non-prescription drugs, or dietary supplements you use. Also tell them if you smoke, drink alcohol, or use illegal drugs. Some items may interact with your medicine. What should I watch for while using this medicine? Visit your healthcare professional for regular checks on your progress. You may need blood work done while you are taking this medicine. Your condition will be monitored carefully while you are receiving this medicine. It is important not to miss any appointments. Avoid sports and activities that might cause injury while you are using this medicine. Severe falls or injuries can cause unseen bleeding. Be careful when using sharp tools or knives. Consider using an Copy. Take special care brushing or flossing your teeth. Report any injuries, bruising, or red spots on the skin to your healthcare professional. If you are going to need surgery or other procedure, tell your healthcare professional that you are taking this medicine. Wear a medical ID bracelet or chain. Carry a card that describes your disease and details of your medicine and dosage times. What side effects may I notice from receiving this medicine? Side effects that you should report to your doctor or health care professional as soon as possible:  allergic reactions like skin rash, itching or hives, swelling of the face, lips, or tongue  signs and symptoms of bleeding such as bloody or black, tarry stools; red or dark-brown urine; spitting up blood or brown material that looks like coffee grounds; red spots on the skin; unusual bruising or bleeding from the eye, gums, or nose  signs and symptoms of a blood clot such as chest pain; shortness of breath; pain, swelling, or warmth in the leg  signs and symptoms of a stroke such as changes in vision; confusion; trouble speaking or understanding; severe headaches; sudden numbness or weakness of the face, arm or leg; trouble  walking; dizziness; loss of coordination This list may not describe all possible side effects. Call your doctor for medical advice about side effects. You may report side effects to FDA at 1-800-FDA-1088. Where should I keep my medicine? Keep out of the reach of children. Store at room temperature between 20 and 25 degrees C (68 and 77 degrees F). Throw away any unused medicine after the expiration date. NOTE: This sheet is a summary. It may not cover all possible information. If you have questions about this medicine, talk to your doctor, pharmacist, or health care provider.  2020 Elsevier/Gold Standard (2018-01-13 17:39:34)

## 2020-03-27 NOTE — Progress Notes (Signed)
HPI [name] returns today for followup of sinus node dysfunction , s/p PPM insertion. In the interim, she has had problems with palpitations and has been found to have atrial fib with a RVR. She has not had syncope. She denies chest pain or sob but does note palpitations. Allergies  Allergen Reactions  . Amoxicillin Diarrhea    Per patient severe diarrhea required hospilization  . Iodine Rash     Current Outpatient Medications  Medication Sig Dispense Refill  . acetaminophen (TYLENOL) 500 MG tablet Take 500-1,000 mg by mouth every 6 (six) hours as needed (for pain.).    Marland Kitchen furosemide (LASIX) 20 MG tablet Take 1 tablet (20 mg total) by mouth daily. 90 tablet 3  . KLOR-CON M20 20 MEQ tablet Take 20 mEq by mouth daily.     . metoprolol succinate (TOPROL XL) 25 MG 24 hr tablet Take 1 tablet (25 mg total) by mouth daily. 90 tablet 3  . metroNIDAZOLE (METROCREAM) 0.75 % cream Apply 1 application topically every other day.     No current facility-administered medications for this visit.     Past Medical History:  Diagnosis Date  . Ankle tendinitis   . Breast cancer ([place])   . Breast cancer, left breast ([place]) 08/09/2012  . Gall stones   . Hip bursitis   . Hypertension   . Menorrhagia   . Migraine   . Osteopenia 03/27/2014  . Osteoporosis   . Personal history of radiation therapy 2012   Left Breast Cancer  . Plantar fasciitis   . RLS (restless legs syndrome)   . Shortness of breath    once a year- gets checked by Dr.  . [name] incontinence   . Trigger finger    CTS    ROS:   All systems reviewed and negative except as noted in the HPI.   Past Surgical History:  Procedure Laterality Date  . ABDOMINAL HYSTERECTOMY     Ovaries retained  . BLADDER SURGERY     Bladder Tack  . BREAST LUMPECTOMY Left 2012  . BREAST MASS EXCISION     Benign lump removal  . CARPAL TUNNEL RELEASE Right 01/11/2013   Procedure: CARPAL TUNNEL RELEASE, RELEASE A-1 PULLEY RIGHT INDEX  FINGER;  Surgeon: Cammie Sickle., MD;  Location: [facility];  Service: Orthopedics;  Laterality: Right;  . CHOLECYSTECTOMY    . PACEMAKER IMPLANT N/A 12/22/2019   Procedure: PACEMAKER IMPLANT;  Surgeon: Evans Lance, MD;  Location: [facility];  Service: Cardiovascular;  Laterality: N/A;  . SHOULDER SURGERY Bilateral      Family History  Problem Relation Age of Onset  . Heart Problems Mother   . Hypertension Mother   . Diabetes Father   . Cancer Sister        Lung and Ovarian  . Diabetes Sister   . Heart Problems Brother   . Diabetes Brother   . Diabetes Sister   . Diabetes Sister   . Breast cancer Maternal Grandmother      Social History   Socioeconomic History  . Marital status: Married    Spouse name: Not on file  . Number of children: Not on file  . Years of education: Not on file  . Highest education level: Not on file  Occupational History  . Not on file  Tobacco Use  . Smoking status: Never Smoker  . Smokeless tobacco: Never Used  Substance and Sexual Activity  . Alcohol  use: No  . Drug use: No  . Sexual activity: Never    Birth control/protection: Post-menopausal  Other Topics Concern  . Not on file  Social History Narrative  . Not on file   Social Determinants of Health   Financial Resource Strain:   . Difficulty of Paying Living Expenses: Not on file  Food Insecurity:   . Worried About Charity fundraiser in the Last Year: Not on file  . Ran Out of Food in the Last Year: Not on file  Transportation Needs:   . Lack of Transportation (Medical): Not on file  . Lack of Transportation (Non-Medical): Not on file  Physical Activity:   . Days of Exercise per Week: Not on file  . Minutes of Exercise per Session: Not on file  Stress:   . Feeling of Stress : Not on file  Social Connections:   . Frequency of Communication with Friends and Family: Not on file  . Frequency of Social Gatherings with Friends and Family: Not on file   . Attends Religious Services: Not on file  . Active Member of Clubs or Organizations: Not on file  . Attends Archivist Meetings: Not on file  . Marital Status: Not on file  Intimate Partner Violence:   . Fear of Current or Ex-Partner: Not on file  . Emotionally Abused: Not on file  . Physically Abused: Not on file  . Sexually Abused: Not on file     BP 122/72   Pulse 63   Ht 5\' 4"  (1.626 m)   Wt 176 lb 9.6 oz (80.1 kg)   SpO2 99%   BMI 30.31 kg/m   Physical Exam:  Well appearing NAD HEENT: Unremarkable Neck:  No JVD, no thyromegally Lymphatics:  No adenopathy Back:  No CVA tenderness Lungs:  Clear HEART:  Regular rate rhythm, no murmurs, no rubs, no clicks Abd:  soft, positive bowel sounds, no organomegally, no rebound, no guarding Ext:  2 plus pulses, no edema, no cyanosis, no clubbing Skin:  No rashes no nodules Neuro:  CN II through XII intact, motor grossly intact  EKG - nsr  DEVICE  Normal device function.  See PaceArt for details.   Assess/Plan: 1. Sinus node dysfunction - she is asymptomatic, s/p PPM insertion. 2. PPM - hier [employer]. Jude DDD PM is working normally. 3. PAF - she has had up to almost 3 hours of atrial fib at a time and has a CHADSVASC of 4. I have recommended she start Eliquis. 4. HTN - her bp is well controlled.  Carleene Overlie Tee Richeson,MD

## 2020-04-24 ENCOUNTER — Other Ambulatory Visit: Payer: PPO

## 2020-05-01 ENCOUNTER — Other Ambulatory Visit: Payer: PPO

## 2020-05-15 ENCOUNTER — Other Ambulatory Visit: Payer: Self-pay

## 2020-05-15 ENCOUNTER — Other Ambulatory Visit: Payer: PPO | Admitting: *Deleted

## 2020-05-15 DIAGNOSIS — I480 Paroxysmal atrial fibrillation: Secondary | ICD-10-CM

## 2020-05-16 LAB — CBC WITH DIFFERENTIAL/PLATELET
Basophils Absolute: 0.1 10*3/uL (ref 0.0–0.2)
Basos: 1 %
EOS (ABSOLUTE): 0.1 10*3/uL (ref 0.0–0.4)
Eos: 2 %
Hematocrit: 39.1 % (ref 34.0–46.6)
Hemoglobin: 13 g/dL (ref 11.1–15.9)
Immature Grans (Abs): 0 10*3/uL (ref 0.0–0.1)
Immature Granulocytes: 1 %
Lymphocytes Absolute: 1.7 10*3/uL (ref 0.7–3.1)
Lymphs: 25 %
MCH: 29.4 pg (ref 26.6–33.0)
MCHC: 33.2 g/dL (ref 31.5–35.7)
MCV: 89 fL (ref 79–97)
Monocytes Absolute: 0.7 10*3/uL (ref 0.1–0.9)
Monocytes: 9 %
Neutrophils Absolute: 4.4 10*3/uL (ref 1.4–7.0)
Neutrophils: 62 %
Platelets: 304 10*3/uL (ref 150–450)
RBC: 4.42 x10E6/uL (ref 3.77–5.28)
RDW: 12.6 % (ref 11.7–15.4)
WBC: 7 10*3/uL (ref 3.4–10.8)

## 2020-06-01 DIAGNOSIS — R350 Frequency of micturition: Secondary | ICD-10-CM | POA: Diagnosis not present

## 2020-06-22 ENCOUNTER — Ambulatory Visit (INDEPENDENT_AMBULATORY_CARE_PROVIDER_SITE_OTHER): Payer: PPO

## 2020-06-22 DIAGNOSIS — I495 Sick sinus syndrome: Secondary | ICD-10-CM

## 2020-06-22 LAB — CUP PACEART REMOTE DEVICE CHECK
Battery Remaining Longevity: 174 mo
Battery Remaining Percentage: 100 %
Brady Statistic RA Percent Paced: 43 %
Brady Statistic RV Percent Paced: 4 %
Date Time Interrogation Session: 20220204034200
Implantable Lead Implant Date: 20210805
Implantable Lead Implant Date: 20210805
Implantable Lead Location: 753859
Implantable Lead Location: 753860
Implantable Lead Model: 7841
Implantable Lead Model: 7842
Implantable Lead Serial Number: 1049950
Implantable Lead Serial Number: 1083642
Implantable Pulse Generator Implant Date: 20210805
Lead Channel Impedance Value: 561 Ohm
Lead Channel Impedance Value: 728 Ohm
Lead Channel Pacing Threshold Amplitude: 0.6 V
Lead Channel Pacing Threshold Amplitude: 0.8 V
Lead Channel Pacing Threshold Pulse Width: 0.4 ms
Lead Channel Pacing Threshold Pulse Width: 0.4 ms
Lead Channel Setting Pacing Amplitude: 1.3 V
Lead Channel Setting Pacing Amplitude: 2 V
Lead Channel Setting Pacing Pulse Width: 0.4 ms
Lead Channel Setting Sensing Sensitivity: 2.5 mV
Pulse Gen Serial Number: 942377

## 2020-06-26 NOTE — Progress Notes (Signed)
Remote pacemaker transmission.   

## 2020-07-03 ENCOUNTER — Other Ambulatory Visit: Payer: Self-pay | Admitting: Internal Medicine

## 2020-07-03 DIAGNOSIS — Z0000 Encounter for general adult medical examination without abnormal findings: Secondary | ICD-10-CM

## 2020-07-19 DIAGNOSIS — L821 Other seborrheic keratosis: Secondary | ICD-10-CM | POA: Diagnosis not present

## 2020-07-19 DIAGNOSIS — I781 Nevus, non-neoplastic: Secondary | ICD-10-CM | POA: Diagnosis not present

## 2020-07-19 DIAGNOSIS — L718 Other rosacea: Secondary | ICD-10-CM | POA: Diagnosis not present

## 2020-07-25 ENCOUNTER — Telehealth: Payer: Self-pay | Admitting: Emergency Medicine

## 2020-07-25 NOTE — Telephone Encounter (Signed)
Patient reports she was at the store paying for clothes during the event. Patient reports of palpitations and felt lightheaded for less than a minute then subsided. Reports the same symptoms overs the past week. Denies any loc. States during times event occurs she is not doing strenuous activity. Reports compliance with medications including Eliquis 5 mg BID, Lasix 20 mg daily, Toprol- XL 25 daily. Advised I will forward to Dr. Lovena Le for review and we will call with any changes.   ED precautions discussed with patient with verbal understanding.  Please call [phone] (please call if needed for update or change in care.)

## 2020-07-25 NOTE — Telephone Encounter (Signed)
LMOM to call [facility], # and office hours provided.  Alert received for NSVT episode 07/24/20 @ 1238 that was 42 beats duration. Assess for s/sx during event.

## 2020-07-25 NOTE — Telephone Encounter (Signed)
The pt left a voicemail returning [place] phone call. Her phone number is [phone].

## 2020-07-31 NOTE — Telephone Encounter (Signed)
Reassured patient that Dr Rayann Heman was sent information concerning alert on 07/25/20. She reports no further issues and was concerned because she had not received a return call. Reassured that at this time there have been no recommendations for a change in her treatment plan and that she will receive a call if Dr Rayann Heman changes her plan of care. Reassured that remote transmissions are followed closely and if she has any new problems or concerns she can call the office and we will get a remote transmission to assess things. Patient reassured.

## 2020-07-31 NOTE — Telephone Encounter (Signed)
Patient called back she states she has never heard back from when she was having her palpitations episode. Patient was told she will receive a call back from a nurse today and patient verbalized understanding

## 2020-08-01 NOTE — Telephone Encounter (Signed)
Increase toprol to 25 bid

## 2020-08-02 MED ORDER — METOPROLOL SUCCINATE ER 25 MG PO TB24: 25.0000 mg | ORAL_TABLET | Freq: Two times a day (BID) | ORAL | 2 refills | Status: DC

## 2020-08-02 NOTE — Telephone Encounter (Signed)
Patient contacted and will begin taking Toprol XL 25 mg BID per Dr Tanna Furry order. Patient requested RX be sent to [facility] in [place]. Education done on changing positions slowly to prevent orthostatic hypotension and to contact [facility] if she has any change in condition or concerns.

## 2020-08-22 ENCOUNTER — Ambulatory Visit
Admission: RE | Admit: 2020-08-22 | Discharge: 2020-08-22 | Disposition: A | Discharge status: Home / Self Care | Payer: PPO | Source: Ambulatory Visit | Attending: Internal Medicine | Admitting: Internal Medicine

## 2020-08-22 DIAGNOSIS — Z1231 Encounter for screening mammogram for malignant neoplasm of breast: Secondary | ICD-10-CM | POA: Diagnosis not present

## 2020-08-22 DIAGNOSIS — Z0000 Encounter for general adult medical examination without abnormal findings: Secondary | ICD-10-CM

## 2020-08-23 DIAGNOSIS — N1831 Chronic kidney disease, stage 3a: Secondary | ICD-10-CM | POA: Diagnosis not present

## 2020-08-23 DIAGNOSIS — I129 Hypertensive chronic kidney disease with stage 1 through stage 4 chronic kidney disease, or unspecified chronic kidney disease: Secondary | ICD-10-CM | POA: Diagnosis not present

## 2020-08-23 DIAGNOSIS — Z136 Encounter for screening for cardiovascular disorders: Secondary | ICD-10-CM | POA: Diagnosis not present

## 2020-08-23 DIAGNOSIS — Z13220 Encounter for screening for lipoid disorders: Secondary | ICD-10-CM | POA: Diagnosis not present

## 2020-08-23 DIAGNOSIS — Z1389 Encounter for screening for other disorder: Secondary | ICD-10-CM | POA: Diagnosis not present

## 2020-08-23 DIAGNOSIS — M810 Age-related osteoporosis without current pathological fracture: Secondary | ICD-10-CM | POA: Diagnosis not present

## 2020-08-23 DIAGNOSIS — Z0000 Encounter for general adult medical examination without abnormal findings: Secondary | ICD-10-CM | POA: Diagnosis not present

## 2020-08-23 DIAGNOSIS — I495 Sick sinus syndrome: Secondary | ICD-10-CM | POA: Diagnosis not present

## 2020-08-23 DIAGNOSIS — Z23 Encounter for immunization: Secondary | ICD-10-CM | POA: Diagnosis not present

## 2020-08-23 DIAGNOSIS — I480 Paroxysmal atrial fibrillation: Secondary | ICD-10-CM | POA: Diagnosis not present

## 2020-09-21 ENCOUNTER — Ambulatory Visit (INDEPENDENT_AMBULATORY_CARE_PROVIDER_SITE_OTHER): Payer: PPO

## 2020-09-21 DIAGNOSIS — I495 Sick sinus syndrome: Secondary | ICD-10-CM | POA: Diagnosis not present

## 2020-09-21 LAB — CUP PACEART REMOTE DEVICE CHECK
Battery Remaining Longevity: 168 mo
Battery Remaining Percentage: 100 %
Brady Statistic RA Percent Paced: 49 %
Brady Statistic RV Percent Paced: 3 %
Date Time Interrogation Session: 20220506051200
Implantable Lead Implant Date: 20210805
Implantable Lead Implant Date: 20210805
Implantable Lead Location: 753859
Implantable Lead Location: 753860
Implantable Lead Model: 7841
Implantable Lead Model: 7842
Implantable Lead Serial Number: 1049950
Implantable Lead Serial Number: 1083642
Implantable Pulse Generator Implant Date: 20210805
Lead Channel Impedance Value: 554 Ohm
Lead Channel Impedance Value: 747 Ohm
Lead Channel Pacing Threshold Amplitude: 0.7 V
Lead Channel Pacing Threshold Amplitude: 0.7 V
Lead Channel Pacing Threshold Pulse Width: 0.4 ms
Lead Channel Pacing Threshold Pulse Width: 0.4 ms
Lead Channel Setting Pacing Amplitude: 1.2 V
Lead Channel Setting Pacing Amplitude: 2 V
Lead Channel Setting Pacing Pulse Width: 0.4 ms
Lead Channel Setting Sensing Sensitivity: 2.5 mV
Pulse Gen Serial Number: 942377

## 2020-10-05 NOTE — Progress Notes (Signed)
Remote pacemaker transmission.   

## 2020-11-08 ENCOUNTER — Other Ambulatory Visit: Payer: Self-pay | Admitting: Physician Assistant

## 2020-11-08 ENCOUNTER — Ambulatory Visit
Admission: RE | Admit: 2020-11-08 | Discharge: 2020-11-08 | Disposition: A | Discharge status: Home / Self Care | Payer: PPO | Source: Ambulatory Visit | Attending: Physician Assistant | Admitting: Physician Assistant

## 2020-11-08 DIAGNOSIS — S8011XA Contusion of right lower leg, initial encounter: Secondary | ICD-10-CM | POA: Diagnosis not present

## 2020-11-08 DIAGNOSIS — M25461 Effusion, right knee: Secondary | ICD-10-CM

## 2020-11-08 DIAGNOSIS — S8991XA Unspecified injury of right lower leg, initial encounter: Secondary | ICD-10-CM

## 2020-11-08 DIAGNOSIS — M7989 Other specified soft tissue disorders: Secondary | ICD-10-CM | POA: Diagnosis not present

## 2020-11-08 DIAGNOSIS — S7011XA Contusion of right thigh, initial encounter: Secondary | ICD-10-CM | POA: Diagnosis not present

## 2020-11-14 DIAGNOSIS — I10 Essential (primary) hypertension: Secondary | ICD-10-CM | POA: Diagnosis not present

## 2020-11-14 DIAGNOSIS — I129 Hypertensive chronic kidney disease with stage 1 through stage 4 chronic kidney disease, or unspecified chronic kidney disease: Secondary | ICD-10-CM | POA: Diagnosis not present

## 2020-11-14 DIAGNOSIS — M810 Age-related osteoporosis without current pathological fracture: Secondary | ICD-10-CM | POA: Diagnosis not present

## 2020-11-14 DIAGNOSIS — I480 Paroxysmal atrial fibrillation: Secondary | ICD-10-CM | POA: Diagnosis not present

## 2020-11-14 DIAGNOSIS — N1831 Chronic kidney disease, stage 3a: Secondary | ICD-10-CM | POA: Diagnosis not present

## 2020-11-15 DIAGNOSIS — M25561 Pain in right knee: Secondary | ICD-10-CM | POA: Diagnosis not present

## 2020-11-26 DIAGNOSIS — R300 Dysuria: Secondary | ICD-10-CM | POA: Diagnosis not present

## 2020-11-28 ENCOUNTER — Other Ambulatory Visit: Payer: Self-pay

## 2020-11-28 ENCOUNTER — Encounter: Payer: Self-pay | Admitting: Cardiology

## 2020-11-28 ENCOUNTER — Ambulatory Visit: Payer: PPO | Admitting: Cardiology

## 2020-11-28 VITALS — BP 118/64 | HR 58 | Ht 64.0 in | Wt 174.8 lb

## 2020-11-28 DIAGNOSIS — I480 Paroxysmal atrial fibrillation: Secondary | ICD-10-CM | POA: Diagnosis not present

## 2020-11-28 DIAGNOSIS — Z950 Presence of cardiac pacemaker: Secondary | ICD-10-CM | POA: Diagnosis not present

## 2020-11-28 DIAGNOSIS — I495 Sick sinus syndrome: Secondary | ICD-10-CM | POA: Diagnosis not present

## 2020-11-28 NOTE — Patient Instructions (Signed)
Medication Instructions:  The current medical regimen is effective;  continue present plan and medications.  *If you need a refill on your cardiac medications before your next appointment, please call your pharmacy*  Follow-Up: At [facility], you and your health needs are our priority.  As part of our continuing mission to provide you with exceptional heart care, we have created designated Provider Care Teams.  These Care Teams include your primary Cardiologist (physician) and Advanced Practice Providers (APPs -  Physician Assistants and Nurse Practitioners) who all work together to provide you with the care you need, when you need it.  We recommend signing up for the patient portal called "MyChart".  Sign up information is provided on this After Visit Summary.  MyChart is used to connect with patients for Virtual Visits (Telemedicine).  Patients are able to view lab/test results, encounter notes, upcoming appointments, etc.  Non-urgent messages can be sent to your provider as well.   To learn more about what you can do with MyChart, go to [email or web address].    Your next appointment:   6 month(s)  The format for your next appointment:   In Person  Provider:   Cecilie Kicks, NP  And Dr Marlou Porch in 1 year.   Thank you for choosing [facility]!!

## 2020-11-28 NOTE — Progress Notes (Signed)
Cardiology Office Note:    Date:  11/28/2020   ID:  [name], DOB Jul 18, 1935, MRN [id number]  PCP:  Kaitlyn Orn, MD   [facility] Providers Cardiologist:  None     Referring MD: Kaitlyn Orn, MD    History of Present Illness:    [name] is a 85 y.o. female here for follow-up of palpitations, pacemaker, followed as well by [name]. Prior 5 second pause. [name] monitor  Previously she had had problems with palpitation and was found to be in atrial fibrillation with rapid ventricular response.  She has not had any syncopal episodes.  No chest pain or shortness of breath. Skips are better in past few months.   She is widowed.  Her mother died with MI Brother died with MI atrial fibrillation sister.  Past Medical History:  Diagnosis Date   Ankle tendinitis    Breast cancer ([place])    Breast cancer, left breast ([place]) 08/09/2012   Gall stones    Hip bursitis    Hypertension    Menorrhagia    Migraine    Osteopenia 03/27/2014   Osteoporosis    Personal history of radiation therapy 2012   Left Breast Cancer   Plantar fasciitis    RLS (restless legs syndrome)    Shortness of breath    once a year- gets checked by Dr.   Rebeca Good incontinence    Trigger finger    CTS    Past Surgical History:  Procedure Laterality Date   ABDOMINAL HYSTERECTOMY     Ovaries retained   BLADDER SURGERY     Bladder Tack   BREAST LUMPECTOMY Left 2012   BREAST MASS EXCISION     Benign lump removal   CARPAL TUNNEL RELEASE Right 01/11/2013   Procedure: CARPAL TUNNEL RELEASE, RELEASE A-1 PULLEY RIGHT INDEX FINGER;  Surgeon: [name]., MD;  Location: [facility];  Service: Orthopedics;  Laterality: Right;   CHOLECYSTECTOMY     PACEMAKER IMPLANT N/A 12/22/2019   Procedure: PACEMAKER IMPLANT;  Surgeon: Kaitlyn Lance, MD;  Location: [facility];  Service: Cardiovascular;  Laterality: N/A;   SHOULDER SURGERY Bilateral     Current  Medications: Current Meds  Medication Sig   acetaminophen (TYLENOL) 500 MG tablet Take 500-1,000 mg by mouth every 6 (six) hours as needed (for pain.).   apixaban (ELIQUIS) 5 MG TABS tablet Take 1 tablet (5 mg total) by mouth 2 (two) times daily.   KLOR-CON M20 20 MEQ tablet Take 20 mEq by mouth daily.    metroNIDAZOLE (METROCREAM) 0.75 % cream Apply 1 application topically every other day.     Allergies:   Amoxicillin and Iodine   Social History   Socioeconomic History   Marital status: Married    Spouse name: Not on file   Number of children: Not on file   Years of education: Not on file   Highest education level: Not on file  Occupational History   Not on file  Tobacco Use   Smoking status: Never   Smokeless tobacco: Never  Substance and Sexual Activity   Alcohol use: No   Drug use: No   Sexual activity: Never    Birth control/protection: Post-menopausal  Other Topics Concern   Not on file  Social History Narrative   Not on file   Social Determinants of Health   Financial Resource Strain: Not on file  Food Insecurity: Not on file  Transportation Needs: Not  on file  Physical Activity: Not on file  Stress: Not on file  Social Connections: Not on file     Family History: The patient's family history includes Breast cancer in her maternal grandmother; Cancer in her sister; Diabetes in her brother, father, sister, sister, and sister; Heart Problems in her brother and mother; Hypertension in her mother.  ROS:   Please see the history of present illness.    She did suffer a mechanical fall at church, tripped over the piano music cart in the aisle.  All other systems reviewed and are negative.  EKGs/Labs/Other Studies Reviewed:    The following studies were reviewed today: Nuclear stress test 11/14/2019-low risk with no ischemia normal ejection fraction  Echocardiogram 11/14/2019-   1. Left ventricular ejection fraction, by estimation, is 60 to 65%. The  left  ventricle has normal function. The left ventricle has no regional  wall motion abnormalities. Left ventricular diastolic parameters are  consistent with Grade I diastolic  dysfunction (impaired relaxation).   2. Right ventricular systolic function is normal. The right ventricular  size is normal. There is normal pulmonary artery systolic pressure. The  estimated right ventricular systolic pressure is 20.2 mmHg.   3. The mitral valve is degenerative. Trivial mitral valve regurgitation.  No evidence of mitral stenosis.   4. The aortic valve is tricuspid. Aortic valve regurgitation is not  visualized. No aortic stenosis is present.   5. The inferior vena cava is normal in size with greater than 50%  respiratory variability, suggesting right atrial pressure of 3 mmHg.   EKG: EKG from 03/27/2020 shows atrial pacing normal sinus rhythm 63 bpm.  Recent Labs: 01/11/2020: BUN 20; Creatinine, Ser 0.80; Potassium 3.8; Sodium 132 05/15/2020: Hemoglobin 13.0; Platelets 304  Recent Lipid Panel No results found for: CHOL, TRIG, HDL, CHOLHDL, VLDL, LDLCALC, LDLDIRECT   Risk Assessment/Calculations:          Physical Exam:    VS:  BP 118/64   Pulse (!) 58   Ht 5\' 4"  (1.626 m)   Wt 174 lb 12.8 oz (79.3 kg)   SpO2 96%   BMI 30.00 kg/m     Wt Readings from Last 3 Encounters:  11/28/20 174 lb 12.8 oz (79.3 kg)  03/27/20 176 lb 9.6 oz (80.1 kg)  12/22/19 170 lb (77.1 kg)     GEN:  Well nourished, well developed in no acute distress HEENT: Normal NECK: No JVD; No carotid bruits LYMPHATICS: No lymphadenopathy CARDIAC: RRR, no murmurs, rubs, gallops RESPIRATORY:  Clear to auscultation without rales, wheezing or rhonchi  ABDOMEN: Soft, non-tender, non-distended MUSCULOSKELETAL:  No edema; No deformity  SKIN: Warm and dry, healing right pretibial leg wound.  Right knee ecchymosis improving.  Suffered from fall. NEUROLOGIC:  Alert and oriented x 3 PSYCHIATRIC:  Normal affect   ASSESSMENT:     1. Pacemaker   2. Paroxysmal atrial fibrillation (HCC)   3. Sick sinus syndrome (HCC)    PLAN:    In order of problems listed above:  Paroxysmal atrial relation - Upon pacemaker check and had almost 3 hours of atrial fibrillation at a time.  She is a CHADSVASc score of 4.  Dr. Lovena Le recommended starting Eliquis because of this.  Understands bleeding risks.  Continue to monitor closely with lab work given this high risk medication.  Continue with medication management. -She will occasionally feel a few palpitations here and there but these seem to have improved.  Pacemaker - [place] dual-chamber pacemaker working normally.  EP notes reviewed.  Has sinus node dysfunction.  She is asymptomatic with this.  Essential hypertension - Blood pressure is currently well controlled on metoprolol succinate 25 mg a day.  LDL 112 triglycerides 157 hemoglobin 13.3 creatinine 0.9 potassium 4.2 ALT 13 from outside labs.   6 months with APP, 1 year with me   Medication Adjustments/Labs and Tests Ordered: Current medicines are reviewed at length with the patient today.  Concerns regarding medicines are outlined above.  No orders of the defined types were placed in this encounter.  No orders of the defined types were placed in this encounter.   Patient Instructions  Medication Instructions:  The current medical regimen is effective;  continue present plan and medications.  *If you need a refill on your cardiac medications before your next appointment, please call your pharmacy*  Follow-Up: At [facility], you and your health needs are our priority.  As part of our continuing mission to provide you with exceptional heart care, we have created designated Provider Care Teams.  These Care Teams include your primary Cardiologist (physician) and Advanced Practice Providers (APPs -  Physician Assistants and Nurse Practitioners) who all work together to provide you with the care you need, when you  need it.  We recommend signing up for the patient portal called "MyChart".  Sign up information is provided on this After Visit Summary.  MyChart is used to connect with patients for Virtual Visits (Telemedicine).  Patients are able to view lab/test results, encounter notes, upcoming appointments, etc.  Non-urgent messages can be sent to your provider as well.   To learn more about what you can do with MyChart, go to [email or web address].    Your next appointment:   6 month(s)  The format for your next appointment:   In Person  Provider:   Cecilie Kicks, NP  And Dr Marlou Porch in 1 year.   Thank you for choosing [facility]!!     Signed, Candee Furbish, MD  11/28/2020 2:11 PM    [facility]

## 2020-11-29 ENCOUNTER — Other Ambulatory Visit: Payer: Self-pay | Admitting: Internal Medicine

## 2020-12-14 DIAGNOSIS — R194 Change in bowel habit: Secondary | ICD-10-CM | POA: Diagnosis not present

## 2020-12-19 ENCOUNTER — Telehealth: Payer: Self-pay | Admitting: Internal Medicine

## 2020-12-19 ENCOUNTER — Other Ambulatory Visit: Payer: Self-pay | Admitting: Internal Medicine

## 2020-12-19 MED ORDER — APIXABAN 5 MG PO TABS: 5.0000 mg | ORAL_TABLET | Freq: Two times a day (BID) | ORAL | 0 refills | Status: DC

## 2020-12-19 NOTE — Telephone Encounter (Signed)
Labs are good, '5mg'$  BID is the correct dose. Ok to give samples

## 2020-12-19 NOTE — Telephone Encounter (Signed)
Pt is aware that we have left 2 weeks worth of Eliquis samples up front for her to pick up. Have also provided the pt with the Patient Assistance information to contact to get that process started.  Pt was very thankful.

## 2020-12-19 NOTE — Telephone Encounter (Signed)
**Note De-Identified Alisyn Lequire Obfuscation** No answer so I left a message on the pts VM asking her to call Jeani Hawking back at Dr Tanna Furry office at [facility] at [phone] and that if she calls after 2 pm today I will call her back tomorrow.

## 2020-12-19 NOTE — Telephone Encounter (Signed)
New message   Patient calling the office for samples of medication:   1.  What medication and dosage are you requesting samples for? Eliquis 5 mg  2.  Are you currently out of this medication? Pt has about 4-5 pills left. She states that she has reached the donut hole.

## 2020-12-20 NOTE — Telephone Encounter (Signed)
**Note De-Identified [name] Obfuscation** The pt called me back and we discussed her applying for asst for Eliquis through [facility] and she is interested. I gave her [employer] phone number and advised her to call them with questions about their  Eliquis program and her eligibilty to be approved for the progarm.  She is aware to request that they mail her an application to her home if it appears she is eligible and that once she receives it to complete her part of the application, obtain required documents per [employer], and to bring all to Dr Larwance Sachs office at [facility] on [address] in [place] to drop off and that we will take care of the provider page and will fax all to [employer].  She verbalized understanding and is aware to call Jeani Hawking at [facility] at [phone] if she has any questions going forward.  She thanked me for our assistance.

## 2020-12-21 ENCOUNTER — Ambulatory Visit (INDEPENDENT_AMBULATORY_CARE_PROVIDER_SITE_OTHER): Payer: PPO

## 2020-12-21 DIAGNOSIS — I480 Paroxysmal atrial fibrillation: Secondary | ICD-10-CM

## 2020-12-24 LAB — CUP PACEART REMOTE DEVICE CHECK
Battery Remaining Longevity: 162 mo
Battery Remaining Percentage: 100 %
Brady Statistic RA Percent Paced: 51 %
Brady Statistic RV Percent Paced: 3 %
Date Time Interrogation Session: 20220805062900
Implantable Lead Implant Date: 20210805
Implantable Lead Implant Date: 20210805
Implantable Lead Location: 753859
Implantable Lead Location: 753860
Implantable Lead Model: 7841
Implantable Lead Model: 7842
Implantable Lead Serial Number: 1049950
Implantable Lead Serial Number: 1083642
Implantable Pulse Generator Implant Date: 20210805
Lead Channel Impedance Value: 528 Ohm
Lead Channel Impedance Value: 727 Ohm
Lead Channel Pacing Threshold Amplitude: 0.7 V
Lead Channel Pacing Threshold Amplitude: 0.7 V
Lead Channel Pacing Threshold Pulse Width: 0.4 ms
Lead Channel Pacing Threshold Pulse Width: 0.4 ms
Lead Channel Setting Pacing Amplitude: 1.2 V
Lead Channel Setting Pacing Amplitude: 2 V
Lead Channel Setting Pacing Pulse Width: 0.4 ms
Lead Channel Setting Sensing Sensitivity: 2.5 mV
Pulse Gen Serial Number: 942377

## 2021-01-10 NOTE — Progress Notes (Signed)
Remote pacemaker transmission.   

## 2021-01-30 DIAGNOSIS — I129 Hypertensive chronic kidney disease with stage 1 through stage 4 chronic kidney disease, or unspecified chronic kidney disease: Secondary | ICD-10-CM | POA: Diagnosis not present

## 2021-01-30 DIAGNOSIS — N1831 Chronic kidney disease, stage 3a: Secondary | ICD-10-CM | POA: Diagnosis not present

## 2021-01-30 DIAGNOSIS — I480 Paroxysmal atrial fibrillation: Secondary | ICD-10-CM | POA: Diagnosis not present

## 2021-01-30 DIAGNOSIS — I10 Essential (primary) hypertension: Secondary | ICD-10-CM | POA: Diagnosis not present

## 2021-01-30 DIAGNOSIS — M810 Age-related osteoporosis without current pathological fracture: Secondary | ICD-10-CM | POA: Diagnosis not present

## 2021-02-14 DIAGNOSIS — H25813 Combined forms of age-related cataract, bilateral: Secondary | ICD-10-CM | POA: Diagnosis not present

## 2021-02-14 DIAGNOSIS — H524 Presbyopia: Secondary | ICD-10-CM | POA: Diagnosis not present

## 2021-02-14 DIAGNOSIS — H52203 Unspecified astigmatism, bilateral: Secondary | ICD-10-CM | POA: Diagnosis not present

## 2021-02-14 DIAGNOSIS — H5213 Myopia, bilateral: Secondary | ICD-10-CM | POA: Diagnosis not present

## 2021-02-25 DIAGNOSIS — Z23 Encounter for immunization: Secondary | ICD-10-CM | POA: Diagnosis not present

## 2021-02-25 DIAGNOSIS — I10 Essential (primary) hypertension: Secondary | ICD-10-CM | POA: Diagnosis not present

## 2021-03-06 DIAGNOSIS — H25012 Cortical age-related cataract, left eye: Secondary | ICD-10-CM | POA: Diagnosis not present

## 2021-03-06 DIAGNOSIS — H2512 Age-related nuclear cataract, left eye: Secondary | ICD-10-CM | POA: Diagnosis not present

## 2021-03-22 ENCOUNTER — Ambulatory Visit (INDEPENDENT_AMBULATORY_CARE_PROVIDER_SITE_OTHER): Payer: PPO

## 2021-03-22 DIAGNOSIS — I495 Sick sinus syndrome: Secondary | ICD-10-CM

## 2021-03-24 LAB — CUP PACEART REMOTE DEVICE CHECK
Battery Remaining Longevity: 162 mo
Battery Remaining Percentage: 100 %
Brady Statistic RA Percent Paced: 51 %
Brady Statistic RV Percent Paced: 2 %
Date Time Interrogation Session: 20221104063000
Implantable Lead Implant Date: 20210805
Implantable Lead Implant Date: 20210805
Implantable Lead Location: 753859
Implantable Lead Location: 753860
Implantable Lead Model: 7841
Implantable Lead Model: 7842
Implantable Lead Serial Number: 1049950
Implantable Lead Serial Number: 1083642
Implantable Pulse Generator Implant Date: 20210805
Lead Channel Impedance Value: 539 Ohm
Lead Channel Impedance Value: 729 Ohm
Lead Channel Pacing Threshold Amplitude: 0.7 V
Lead Channel Pacing Threshold Amplitude: 0.8 V
Lead Channel Pacing Threshold Pulse Width: 0.4 ms
Lead Channel Pacing Threshold Pulse Width: 0.4 ms
Lead Channel Setting Pacing Amplitude: 1.3 V
Lead Channel Setting Pacing Amplitude: 2 V
Lead Channel Setting Pacing Pulse Width: 0.4 ms
Lead Channel Setting Sensing Sensitivity: 2.5 mV
Pulse Gen Serial Number: 942377

## 2021-03-27 NOTE — Progress Notes (Signed)
Remote pacemaker transmission.   

## 2021-03-28 ENCOUNTER — Other Ambulatory Visit: Payer: Self-pay

## 2021-03-28 MED ORDER — METOPROLOL SUCCINATE ER 25 MG PO TB24: 25.0000 mg | ORAL_TABLET | Freq: Two times a day (BID) | ORAL | 0 refills | Status: DC

## 2021-03-28 NOTE — Telephone Encounter (Signed)
Pt's medication was sent to pt's pharmacy as requested. Confirmation received.  °

## 2021-04-02 ENCOUNTER — Encounter: Payer: Self-pay | Admitting: Internal Medicine

## 2021-04-02 ENCOUNTER — Ambulatory Visit (INDEPENDENT_AMBULATORY_CARE_PROVIDER_SITE_OTHER): Payer: PPO | Admitting: Internal Medicine

## 2021-04-02 ENCOUNTER — Other Ambulatory Visit: Payer: Self-pay

## 2021-04-02 VITALS — BP 138/80 | HR 65 | Ht 64.0 in | Wt 183.6 lb

## 2021-04-02 DIAGNOSIS — Z950 Presence of cardiac pacemaker: Secondary | ICD-10-CM

## 2021-04-02 DIAGNOSIS — I495 Sick sinus syndrome: Secondary | ICD-10-CM | POA: Diagnosis not present

## 2021-04-02 NOTE — Progress Notes (Signed)
HPI Mrs. Sample returns today for followup of sinus node dysfunction , s/p PPM insertion. In the interim, she has had minimal problems with palpitations and has been found to have atrial fib with a RVR. She has not had syncope. She denies chest pain or sob but does note palpitations. She has been on eliquis. Allergies  Allergen Reactions   Amoxicillin Diarrhea    Per patient severe diarrhea required hospilization   Iodine Rash     Current Outpatient Medications  Medication Sig Dispense Refill   acetaminophen (TYLENOL) 500 MG tablet Take 500-1,000 mg by mouth every 6 (six) hours as needed (for pain.).     apixaban (ELIQUIS) 5 MG TABS tablet Take 1 tablet (5 mg total) by mouth 2 (two) times daily. 28 tablet 0   furosemide (LASIX) 20 MG tablet Take 1 tablet by mouth once daily 90 tablet 3   KLOR-CON M20 20 MEQ tablet Take 20 mEq by mouth daily.      metoprolol succinate (TOPROL-XL) 25 MG 24 hr tablet Take 1 tablet (25 mg total) by mouth 2 (two) times daily. 60 tablet 0   metroNIDAZOLE (METROCREAM) 0.75 % cream Apply 1 application topically every other day.     No current facility-administered medications for this visit.     Past Medical History:  Diagnosis Date   Ankle tendinitis    Breast cancer ([place])    Breast cancer, left breast ([place]) 08/09/2012   Gall stones    Hip bursitis    Hypertension    Menorrhagia    Migraine    Osteopenia 03/27/2014   Osteoporosis    Personal history of radiation therapy 2012   Left Breast Cancer   Plantar fasciitis    RLS (restless legs syndrome)    Shortness of breath    once a year- gets checked by Dr.   [name] incontinence    Trigger finger    CTS    ROS:   All systems reviewed and negative except as noted in the HPI.   Past Surgical History:  Procedure Laterality Date   ABDOMINAL HYSTERECTOMY     Ovaries retained   BLADDER SURGERY     Bladder Tack   BREAST LUMPECTOMY Left 2012   BREAST MASS EXCISION     Benign lump  removal   CARPAL TUNNEL RELEASE Right 01/11/2013   Procedure: CARPAL TUNNEL RELEASE, RELEASE A-1 PULLEY RIGHT INDEX FINGER;  Surgeon: Cammie Sickle., MD;  Location: [facility];  Service: Orthopedics;  Laterality: Right;   CHOLECYSTECTOMY     PACEMAKER IMPLANT N/A 12/22/2019   Procedure: PACEMAKER IMPLANT;  Surgeon: Evans Lance, MD;  Location: [place] [facility];  Service: Cardiovascular;  Laterality: N/A;   SHOULDER SURGERY Bilateral      Family History  Problem Relation Age of Onset   Heart Problems Mother    Hypertension Mother    Diabetes Father    Cancer Sister        Lung and Ovarian   Diabetes Sister    Heart Problems Brother    Diabetes Brother    Diabetes Sister    Diabetes Sister    Breast cancer Maternal Grandmother      Social History   Socioeconomic History   Marital status: Married    Spouse name: Not on file   Number of children: Not on file   Years of education: Not on file   Highest education level: Not on file  Occupational  History   Not on file  Tobacco Use   Smoking status: Never   Smokeless tobacco: Never  Substance and Sexual Activity   Alcohol use: No   Drug use: No   Sexual activity: Never    Birth control/protection: Post-menopausal  Other Topics Concern   Not on file  Social History Narrative   Not on file   Social Determinants of Health   Financial Resource Strain: Not on file  Food Insecurity: Not on file  Transportation Needs: Not on file  Physical Activity: Not on file  Stress: Not on file  Social Connections: Not on file  Intimate Partner Violence: Not on file     BP 138/80   Pulse 65   Ht 5\' 4"  (1.626 m)   Wt 183 lb 9.6 oz (83.3 kg)   SpO2 98%   BMI 31.51 kg/m   Physical Exam:  Well appearing NAD HEENT: Unremarkable Neck:  No JVD, no thyromegally Lymphatics:  No adenopathy Back:  No CVA tenderness Lungs:  Clear with no wheezes HEART:  IRegular rate rhythm, no murmurs, no rubs, no  clicks Abd:  soft, positive bowel sounds, no organomegally, no rebound, no guarding Ext:  2 plus pulses, no edema, no cyanosis, no clubbing Skin:  No rashes no nodules Neuro:  CN II through XII intact, motor grossly intact  EKG - nsr  DEVICE  Normal device function.  See PaceArt for details.   Assess/Plan:  1. Sinus node dysfunction - she is asymptomatic, s/p PPM insertion. 2. PPM - her St. Jude DDD PM is working normally. 3. PAF - she has tolerated her eliquis. She has rare palps.  4. HTN - her bp is well controlled.   Carleene Overlie Kasir Hallenbeck,MD

## 2021-04-02 NOTE — Patient Instructions (Addendum)
Medication Instructions:  Your physician recommends that you continue on your current medications as directed. Please refer to the Current Medication list given to you today.  Labwork: None ordered.  Testing/Procedures: None ordered.  Follow-Up: Your physician wants you to follow-up in: one year with Tommye Standard, PA-C.  Remote monitoring is used to monitor your Pacemaker from home. This monitoring reduces the number of office visits required to check your device to one time per year. It allows [place] to keep an eye on the functioning of your device to ensure it is working properly. You are scheduled for a device check from home on 06/21/2021. You may send your transmission at any time that day. If you have a wireless device, the transmission will be sent automatically. After your physician reviews your transmission, you will receive a postcard with your next transmission date.  Any Other Special Instructions Will Be Listed Below (If Applicable).  If you need a refill on your cardiac medications before your next appointment, please call your pharmacy.

## 2021-04-03 ENCOUNTER — Other Ambulatory Visit: Payer: Self-pay | Admitting: Internal Medicine

## 2021-04-03 DIAGNOSIS — H2511 Age-related nuclear cataract, right eye: Secondary | ICD-10-CM | POA: Diagnosis not present

## 2021-04-03 NOTE — Telephone Encounter (Signed)
Prescription refill request for Eliquis received. Indication: Afib  Last office visit:04/02/21 Lovena Le)  Scr:0.91 (08/23/20 via [facility])  Age: 85 Weight: 83.3kg  Appropriate dose and refill sent to requested pharmacy.

## 2021-04-05 LAB — CUP PACEART INCLINIC DEVICE CHECK
Brady Statistic RA Percent Paced: 51 %
Brady Statistic RV Percent Paced: 2 %
Date Time Interrogation Session: 20221115000000
Implantable Lead Implant Date: 20210805
Implantable Lead Implant Date: 20210805
Implantable Lead Location: 753859
Implantable Lead Location: 753860
Implantable Lead Model: 7841
Implantable Lead Model: 7842
Implantable Lead Serial Number: 1049950
Implantable Lead Serial Number: 1083642
Implantable Pulse Generator Implant Date: 20210805
Lead Channel Impedance Value: 550 Ohm
Lead Channel Impedance Value: 740 Ohm
Lead Channel Pacing Threshold Amplitude: 0.7 V
Lead Channel Pacing Threshold Amplitude: 0.9 V
Lead Channel Pacing Threshold Pulse Width: 0.4 ms
Lead Channel Pacing Threshold Pulse Width: 0.4 ms
Lead Channel Sensing Intrinsic Amplitude: 13.9 mV
Lead Channel Sensing Intrinsic Amplitude: 5.5 mV
Lead Channel Setting Pacing Amplitude: 1.3 V
Lead Channel Setting Pacing Amplitude: 2 V
Lead Channel Setting Pacing Pulse Width: 0.4 ms
Lead Channel Setting Sensing Sensitivity: 2.5 mV
Pulse Gen Serial Number: 942377

## 2021-04-14 ENCOUNTER — Other Ambulatory Visit: Payer: Self-pay | Admitting: Internal Medicine

## 2021-04-14 DIAGNOSIS — I480 Paroxysmal atrial fibrillation: Secondary | ICD-10-CM

## 2021-04-16 NOTE — Telephone Encounter (Signed)
Eliquis 5mg  refill request received. Patient is 85 years old, weight-83.3kg, Crea-0.91 on 08/23/2020 via [facility] at [place], [place], and last seen by Dr. Lovena Le on 04/02/2021. Dose is appropriate based on dosing criteria. Will send in refill to requested pharmacy.

## 2021-04-24 DIAGNOSIS — H25011 Cortical age-related cataract, right eye: Secondary | ICD-10-CM | POA: Diagnosis not present

## 2021-04-24 DIAGNOSIS — H2511 Age-related nuclear cataract, right eye: Secondary | ICD-10-CM | POA: Diagnosis not present

## 2021-06-19 DIAGNOSIS — R300 Dysuria: Secondary | ICD-10-CM | POA: Diagnosis not present

## 2021-06-21 ENCOUNTER — Ambulatory Visit (INDEPENDENT_AMBULATORY_CARE_PROVIDER_SITE_OTHER): Payer: PPO

## 2021-06-21 DIAGNOSIS — I495 Sick sinus syndrome: Secondary | ICD-10-CM | POA: Diagnosis not present

## 2021-06-21 LAB — CUP PACEART REMOTE DEVICE CHECK
Battery Remaining Longevity: 162 mo
Battery Remaining Percentage: 100 %
Brady Statistic RA Percent Paced: 55 %
Brady Statistic RV Percent Paced: 1 %
Date Time Interrogation Session: 20230203034200
Implantable Lead Implant Date: 20210805
Implantable Lead Implant Date: 20210805
Implantable Lead Location: 753859
Implantable Lead Location: 753860
Implantable Lead Model: 7841
Implantable Lead Model: 7842
Implantable Lead Serial Number: 1049950
Implantable Lead Serial Number: 1083642
Implantable Pulse Generator Implant Date: 20210805
Lead Channel Impedance Value: 543 Ohm
Lead Channel Impedance Value: 717 Ohm
Lead Channel Pacing Threshold Amplitude: 0.7 V
Lead Channel Pacing Threshold Amplitude: 0.9 V
Lead Channel Pacing Threshold Pulse Width: 0.4 ms
Lead Channel Pacing Threshold Pulse Width: 0.4 ms
Lead Channel Setting Pacing Amplitude: 1.3 V
Lead Channel Setting Pacing Amplitude: 2 V
Lead Channel Setting Pacing Pulse Width: 0.4 ms
Lead Channel Setting Sensing Sensitivity: 2.5 mV
Pulse Gen Serial Number: 942377

## 2021-06-26 NOTE — Progress Notes (Signed)
Remote pacemaker transmission.   

## 2021-07-03 DIAGNOSIS — Z961 Presence of intraocular lens: Secondary | ICD-10-CM | POA: Diagnosis not present

## 2021-07-17 ENCOUNTER — Other Ambulatory Visit: Payer: Self-pay | Admitting: Internal Medicine

## 2021-07-17 DIAGNOSIS — Z1231 Encounter for screening mammogram for malignant neoplasm of breast: Secondary | ICD-10-CM

## 2021-08-22 DIAGNOSIS — Z961 Presence of intraocular lens: Secondary | ICD-10-CM | POA: Diagnosis not present

## 2021-08-23 ENCOUNTER — Ambulatory Visit
Admission: RE | Admit: 2021-08-23 | Discharge: 2021-08-23 | Disposition: A | Discharge status: Home / Self Care | Payer: PPO | Source: Ambulatory Visit | Attending: Internal Medicine | Admitting: Internal Medicine

## 2021-08-23 DIAGNOSIS — Z1231 Encounter for screening mammogram for malignant neoplasm of breast: Secondary | ICD-10-CM

## 2021-08-26 DIAGNOSIS — G43909 Migraine, unspecified, not intractable, without status migrainosus: Secondary | ICD-10-CM | POA: Diagnosis not present

## 2021-08-26 DIAGNOSIS — Z1389 Encounter for screening for other disorder: Secondary | ICD-10-CM | POA: Diagnosis not present

## 2021-08-26 DIAGNOSIS — I471 Supraventricular tachycardia: Secondary | ICD-10-CM | POA: Diagnosis not present

## 2021-08-26 DIAGNOSIS — Z0000 Encounter for general adult medical examination without abnormal findings: Secondary | ICD-10-CM | POA: Diagnosis not present

## 2021-08-26 DIAGNOSIS — Z853 Personal history of malignant neoplasm of breast: Secondary | ICD-10-CM | POA: Diagnosis not present

## 2021-08-26 DIAGNOSIS — I480 Paroxysmal atrial fibrillation: Secondary | ICD-10-CM | POA: Diagnosis not present

## 2021-08-26 DIAGNOSIS — N1831 Chronic kidney disease, stage 3a: Secondary | ICD-10-CM | POA: Diagnosis not present

## 2021-08-26 DIAGNOSIS — I495 Sick sinus syndrome: Secondary | ICD-10-CM | POA: Diagnosis not present

## 2021-08-26 DIAGNOSIS — I129 Hypertensive chronic kidney disease with stage 1 through stage 4 chronic kidney disease, or unspecified chronic kidney disease: Secondary | ICD-10-CM | POA: Diagnosis not present

## 2021-09-20 ENCOUNTER — Ambulatory Visit (INDEPENDENT_AMBULATORY_CARE_PROVIDER_SITE_OTHER): Payer: PPO

## 2021-09-20 DIAGNOSIS — I495 Sick sinus syndrome: Secondary | ICD-10-CM | POA: Diagnosis not present

## 2021-09-20 LAB — CUP PACEART REMOTE DEVICE CHECK
Battery Remaining Longevity: 156 mo
Battery Remaining Percentage: 100 %
Brady Statistic RA Percent Paced: 55 %
Brady Statistic RV Percent Paced: 1 %
Date Time Interrogation Session: 20230505035400
Implantable Lead Implant Date: 20210805
Implantable Lead Implant Date: 20210805
Implantable Lead Location: 753859
Implantable Lead Location: 753860
Implantable Lead Model: 7841
Implantable Lead Model: 7842
Implantable Lead Serial Number: 1049950
Implantable Lead Serial Number: 1083642
Implantable Pulse Generator Implant Date: 20210805
Lead Channel Impedance Value: 536 Ohm
Lead Channel Impedance Value: 708 Ohm
Lead Channel Pacing Threshold Amplitude: 0.6 V
Lead Channel Pacing Threshold Amplitude: 0.8 V
Lead Channel Pacing Threshold Pulse Width: 0.4 ms
Lead Channel Pacing Threshold Pulse Width: 0.4 ms
Lead Channel Setting Pacing Amplitude: 1.2 V
Lead Channel Setting Pacing Amplitude: 2 V
Lead Channel Setting Pacing Pulse Width: 0.4 ms
Lead Channel Setting Sensing Sensitivity: 2.5 mV
Pulse Gen Serial Number: 942377

## 2021-09-26 DIAGNOSIS — L720 Epidermal cyst: Secondary | ICD-10-CM | POA: Diagnosis not present

## 2021-09-26 DIAGNOSIS — L738 Other specified follicular disorders: Secondary | ICD-10-CM | POA: Diagnosis not present

## 2021-09-26 DIAGNOSIS — L84 Corns and callosities: Secondary | ICD-10-CM | POA: Diagnosis not present

## 2021-09-26 DIAGNOSIS — L718 Other rosacea: Secondary | ICD-10-CM | POA: Diagnosis not present

## 2021-10-03 NOTE — Progress Notes (Signed)
Remote pacemaker transmission.   

## 2021-11-01 ENCOUNTER — Other Ambulatory Visit: Payer: Self-pay | Admitting: Internal Medicine

## 2021-11-01 DIAGNOSIS — I480 Paroxysmal atrial fibrillation: Secondary | ICD-10-CM

## 2021-11-01 NOTE — Telephone Encounter (Signed)
Eliquis '5mg'$  refill request received. Patient is 86 years old, weight-83.3kg, Crea-0.84 on 08/26/21 via [facility] from [place], [place], and last seen by Dr. Lovena Le on 11/05/23/2020. Dose is appropriate based on dosing criteria. Will send in refill to requested pharmacy.

## 2021-11-25 ENCOUNTER — Encounter: Payer: Self-pay | Admitting: Cardiology

## 2021-11-25 ENCOUNTER — Ambulatory Visit: Payer: PPO | Admitting: Cardiology

## 2021-11-25 DIAGNOSIS — I480 Paroxysmal atrial fibrillation: Secondary | ICD-10-CM

## 2021-11-25 DIAGNOSIS — Z950 Presence of cardiac pacemaker: Secondary | ICD-10-CM

## 2021-11-25 DIAGNOSIS — I495 Sick sinus syndrome: Secondary | ICD-10-CM | POA: Diagnosis not present

## 2021-11-25 NOTE — Progress Notes (Signed)
Cardiology Office Note:    Date:  11/25/2021   ID:  [name], DOB [birth year], MRN [id number]  PCP:  Lavone Orn, MD   [facility] Providers Cardiologist:  Candee Furbish, MD     Referring MD: Lavone Orn, MD    History of Present Illness:    [name] is a 86 y.o. female here for follow-up paroxysmal atrial fibrillation, pacemaker secondary to sinus node dysfunction.  Chronic anticoagulation with Eliquis.  No syncope, no chest pain.  Occasionally will have palpitations.  Previously had 5-second pause.  Pacemaker placed by Dr. Lovena Le.  Overall she is doing well.  She does have a small little fluid collection on the dorsum of her left wrist which she states she noticed after the pacemaker.  It is soft.  No evidence of thrombosis.  She also asked about the results of her nuclear stress test.  This was normal.  This was back in 2021.  She continues with low-dose Lasix 20 mg a day.  She is widowed.  Mother died with MI.  Brother died with MI.  Her sister has atrial fibrillation.  Past Medical History:  Diagnosis Date   Ankle tendinitis    Breast cancer ([place])    Breast cancer, left breast ([place]) 08/09/2012   Gall stones    Hip bursitis    Hypertension    Menorrhagia    Migraine    Osteopenia 03/27/2014   Osteoporosis    Personal history of radiation therapy 2012   Left Breast Cancer   Plantar fasciitis    RLS (restless legs syndrome)    Shortness of breath    once a year- gets checked by Dr.   [name] incontinence    Trigger finger    CTS    Past Surgical History:  Procedure Laterality Date   ABDOMINAL HYSTERECTOMY     Ovaries retained   BLADDER SURGERY     Bladder Tack   BREAST LUMPECTOMY Left 2012   BREAST MASS EXCISION     Benign lump removal   CARPAL TUNNEL RELEASE Right 01/11/2013   Procedure: CARPAL TUNNEL RELEASE, RELEASE A-1 PULLEY RIGHT INDEX FINGER;  Surgeon: Cammie Sickle., MD;  Location: [facility];  Service:  Orthopedics;  Laterality: Right;   CHOLECYSTECTOMY     PACEMAKER IMPLANT N/A 12/22/2019   Procedure: PACEMAKER IMPLANT;  Surgeon: Evans Lance, MD;  Location: [place] [facility];  Service: Cardiovascular;  Laterality: N/A;   SHOULDER SURGERY Bilateral     Current Medications: Current Meds  Medication Sig   acetaminophen (TYLENOL) 500 MG tablet Take 500-1,000 mg by mouth every 6 (six) hours as needed (for pain.).   apixaban (ELIQUIS) 5 MG TABS tablet Take 1 tablet by mouth twice daily   furosemide (LASIX) 20 MG tablet Take 1 tablet by mouth once daily   KLOR-CON M20 20 MEQ tablet Take 20 mEq by mouth daily.    metoprolol succinate (TOPROL-XL) 25 MG 24 hr tablet Take 1 tablet by mouth twice daily   metroNIDAZOLE (METROCREAM) 0.75 % cream Apply 1 application topically every other day.     Allergies:   Amoxicillin and Iodine   Social History   Socioeconomic History   Marital status: Married    Spouse name: Not on file   Number of children: Not on file   Years of education: Not on file   Highest education level: Not on file  Occupational History   Not on file  Tobacco Use  Smoking status: Never   Smokeless tobacco: Never  Substance and Sexual Activity   Alcohol use: No   Drug use: No   Sexual activity: Never    Birth control/protection: Post-menopausal  Other Topics Concern   Not on file  Social History Narrative   Not on file   Social Determinants of Health   Financial Resource Strain: Not on file  Food Insecurity: Not on file  Transportation Needs: Not on file  Physical Activity: Not on file  Stress: Not on file  Social Connections: Not on file     Family History: The patient's family history includes Breast cancer in her maternal grandmother; Cancer in her sister; Diabetes in her brother, father, sister, sister, and sister; Heart Problems in her brother and mother; Hypertension in her mother.  ROS:   Please see the history of present illness.     All other  systems reviewed and are negative.  EKGs/Labs/Other Studies Reviewed:    The following studies were reviewed today: Echocardiogram 11/14/2019-   1. Left ventricular ejection fraction, by estimation, is 60 to 65%. The  left ventricle has normal function. The left ventricle has no regional  wall motion abnormalities. Left ventricular diastolic parameters are  consistent with Grade I diastolic  dysfunction (impaired relaxation).   2. Right ventricular systolic function is normal. The right ventricular  size is normal. There is normal pulmonary artery systolic pressure. The  estimated right ventricular systolic pressure is 56.3 mmHg.   3. The mitral valve is degenerative. Trivial mitral valve regurgitation.  No evidence of mitral stenosis.   4. The aortic valve is tricuspid. Aortic valve regurgitation is not  visualized. No aortic stenosis is present.   5. The inferior vena cava is normal in size with greater than 50%  respiratory variability, suggesting right atrial pressure of 3 mmHg.   NUC STRESS 2021 Nuclear stress EF: 87%. The left ventricular ejection fraction is hyperdynamic (>65%). There was no ST segment deviation noted during stress. This is a low risk study. There is no evidence of ischemia and no evidence of previous infarction. The study is normal.  EKG:  EKG is not ordered today.    Recent Labs: No results found for requested labs within last 365 days.  Recent Lipid Panel No results found for: "CHOL", "TRIG", "HDL", "CHOLHDL", "VLDL", "LDLCALC", "LDLDIRECT"  Total cholesterol 183 LDL 112 triglycerides 157.  Hemoglobin 12.6 creatinine 0.8 potassium 4.3 TSH 1.3  Risk Assessment/Calculations:    CHA2DS2-VASc Score = 3   This indicates a 3.2% annual risk of stroke. The patient's score is based upon: CHF History: 0 HTN History: 0 Diabetes History: 0 Stroke History: 0 Vascular Disease History: 0 Age Score: 2 Gender Score: 1              Physical Exam:     VS:  BP 120/60 (BP Location: Left Arm, Patient Position: Sitting, Cuff Size: Normal)   Pulse 60   Ht '5\' 4"'$  (1.626 m)   Wt 182 lb (82.6 kg)   SpO2 97%   BMI 31.24 kg/m     Wt Readings from Last 3 Encounters:  11/25/21 182 lb (82.6 kg)  04/02/21 183 lb 9.6 oz (83.3 kg)  11/28/20 174 lb 12.8 oz (79.3 kg)     GEN:  Well nourished, well developed in no acute distress HEENT: Normal NECK: No JVD; No carotid bruits LYMPHATICS: No lymphadenopathy CARDIAC: RRR, no murmurs, no rubs, gallops RESPIRATORY:  Clear to auscultation without rales, wheezing or rhonchi  ABDOMEN: Soft, non-tender, non-distended MUSCULOSKELETAL:  No edema; No deformity  SKIN: Warm and dry NEUROLOGIC:  Alert and oriented x 3 PSYCHIATRIC:  Normal affect   ASSESSMENT:    1. Paroxysmal atrial fibrillation (HCC)   2. Tachycardia-bradycardia syndrome ([place])   3. Pacemaker    PLAN:    In order of problems listed above:  Paroxysmal atrial fibrillation (HCC) On Eliquis 5 mg twice a day for anticoagulation prescription drug management.  High risk medication is being monitored with blood work.  Previously had 3 hours of atrial fibrillation.  Tachycardia-bradycardia syndrome ([place]) Permanent Saint Jude pacemaker in place.  Dr. Lovena Le.  Notes were reviewed.  He is monitoring function closely.  Pacemaker [place].  Monitoring functioning.         Medication Adjustments/Labs and Tests Ordered: Current medicines are reviewed at length with the patient today.  Concerns regarding medicines are outlined above.  No orders of the defined types were placed in this encounter.  No orders of the defined types were placed in this encounter.   Patient Instructions  Medication Instructions:  Your physician recommends that you continue on your current medications as directed. Please refer to the Current Medication list given to you today.  *If you need a refill on your cardiac medications before your next appointment,  please call your pharmacy*   Follow-Up: At [facility], you and your health needs are our priority.  As part of our continuing mission to provide you with exceptional heart care, we have created designated Provider Care Teams.  These Care Teams include your primary Cardiologist (physician) and Advanced Practice Providers (APPs -  Physician Assistants and Nurse Practitioners) who all work together to provide you with the care you need, when you need it.  We recommend signing up for the patient portal called "MyChart".  Sign up information is provided on this After Visit Summary.  MyChart is used to connect with patients for Virtual Visits (Telemedicine).  Patients are able to view lab/test results, encounter notes, upcoming appointments, etc.  Non-urgent messages can be sent to your provider as well.   To learn more about what you can do with MyChart, go to [email or web address].    Your next appointment:   12 month(s)  The format for your next appointment:   In Person  Provider:   Candee Furbish, MD    Important Information About Sugar         Signed, Candee Furbish, MD  11/25/2021 11:29 AM    Deputy

## 2021-11-25 NOTE — Assessment & Plan Note (Signed)
Permanent Saint Jude pacemaker in place.  Dr. Lovena Le.  Notes were reviewed.  He is monitoring function closely.

## 2021-11-25 NOTE — Assessment & Plan Note (Signed)
On Eliquis 5 mg twice a day for anticoagulation prescription drug management.  High risk medication is being monitored with blood work.  Previously had 3 hours of atrial fibrillation.

## 2021-11-25 NOTE — Patient Instructions (Signed)
Medication Instructions:  Your physician recommends that you continue on your current medications as directed. Please refer to the Current Medication list given to you today.  *If you need a refill on your cardiac medications before your next appointment, please call your pharmacy*   Follow-Up: At [facility], you and your health needs are our priority.  As part of our continuing mission to provide you with exceptional heart care, we have created designated Provider Care Teams.  These Care Teams include your primary Cardiologist (physician) and Advanced Practice Providers (APPs -  Physician Assistants and Nurse Practitioners) who all work together to provide you with the care you need, when you need it.  We recommend signing up for the patient portal called "MyChart".  Sign up information is provided on this After Visit Summary.  MyChart is used to connect with patients for Virtual Visits (Telemedicine).  Patients are able to view lab/test results, encounter notes, upcoming appointments, etc.  Non-urgent messages can be sent to your provider as well.   To learn more about what you can do with MyChart, go to [email or web address].    Your next appointment:   12 month(s)  The format for your next appointment:   In Person  Provider:   Candee Furbish, MD    Important Information About Sugar

## 2021-11-25 NOTE — Assessment & Plan Note (Signed)
Saint Jude.  Monitoring functioning.

## 2021-12-11 ENCOUNTER — Other Ambulatory Visit: Payer: Self-pay | Admitting: Cardiology

## 2021-12-20 ENCOUNTER — Ambulatory Visit (INDEPENDENT_AMBULATORY_CARE_PROVIDER_SITE_OTHER): Payer: PPO

## 2021-12-20 DIAGNOSIS — I495 Sick sinus syndrome: Secondary | ICD-10-CM | POA: Diagnosis not present

## 2021-12-20 LAB — CUP PACEART REMOTE DEVICE CHECK
Battery Remaining Longevity: 156 mo
Battery Remaining Percentage: 100 %
Brady Statistic RA Percent Paced: 56 %
Brady Statistic RV Percent Paced: 2 %
Date Time Interrogation Session: 20230804060100
Implantable Lead Implant Date: 20210805
Implantable Lead Implant Date: 20210805
Implantable Lead Location: 753859
Implantable Lead Location: 753860
Implantable Lead Model: 7841
Implantable Lead Model: 7842
Implantable Lead Serial Number: 1049950
Implantable Lead Serial Number: 1083642
Implantable Pulse Generator Implant Date: 20210805
Lead Channel Impedance Value: 539 Ohm
Lead Channel Impedance Value: 709 Ohm
Lead Channel Pacing Threshold Amplitude: 0.7 V
Lead Channel Pacing Threshold Amplitude: 0.9 V
Lead Channel Pacing Threshold Pulse Width: 0.4 ms
Lead Channel Pacing Threshold Pulse Width: 0.4 ms
Lead Channel Setting Pacing Amplitude: 1.4 V
Lead Channel Setting Pacing Amplitude: 2 V
Lead Channel Setting Pacing Pulse Width: 0.4 ms
Lead Channel Setting Sensing Sensitivity: 2.5 mV
Pulse Gen Serial Number: 942377

## 2022-01-09 NOTE — Progress Notes (Signed)
Remote pacemaker transmission.   

## 2022-03-21 ENCOUNTER — Ambulatory Visit (INDEPENDENT_AMBULATORY_CARE_PROVIDER_SITE_OTHER): Payer: PPO

## 2022-03-21 DIAGNOSIS — I495 Sick sinus syndrome: Secondary | ICD-10-CM | POA: Diagnosis not present

## 2022-03-21 LAB — CUP PACEART REMOTE DEVICE CHECK
Battery Remaining Longevity: 150 mo
Battery Remaining Percentage: 100 %
Brady Statistic RA Percent Paced: 56 %
Brady Statistic RV Percent Paced: 2 %
Date Time Interrogation Session: 20231103034000
Implantable Lead Connection Status: 753985
Implantable Lead Connection Status: 753985
Implantable Lead Implant Date: 20210805
Implantable Lead Implant Date: 20210805
Implantable Lead Location: 753859
Implantable Lead Location: 753860
Implantable Lead Model: 7841
Implantable Lead Model: 7842
Implantable Lead Serial Number: 1049950
Implantable Lead Serial Number: 1083642
Implantable Pulse Generator Implant Date: 20210805
Lead Channel Impedance Value: 540 Ohm
Lead Channel Impedance Value: 685 Ohm
Lead Channel Pacing Threshold Amplitude: 0.7 V
Lead Channel Pacing Threshold Amplitude: 0.7 V
Lead Channel Pacing Threshold Pulse Width: 0.4 ms
Lead Channel Pacing Threshold Pulse Width: 0.4 ms
Lead Channel Setting Pacing Amplitude: 1.1 V
Lead Channel Setting Pacing Amplitude: 2 V
Lead Channel Setting Pacing Pulse Width: 0.4 ms
Lead Channel Setting Sensing Sensitivity: 2.5 mV
Pulse Gen Serial Number: 942377
Zone Setting Status: 755011

## 2022-03-31 NOTE — Progress Notes (Signed)
Remote pacemaker transmission.   

## 2022-04-01 DIAGNOSIS — I495 Sick sinus syndrome: Secondary | ICD-10-CM | POA: Diagnosis not present

## 2022-04-01 DIAGNOSIS — I10 Essential (primary) hypertension: Secondary | ICD-10-CM | POA: Diagnosis not present

## 2022-04-01 DIAGNOSIS — Z23 Encounter for immunization: Secondary | ICD-10-CM | POA: Diagnosis not present

## 2022-04-01 DIAGNOSIS — I480 Paroxysmal atrial fibrillation: Secondary | ICD-10-CM | POA: Diagnosis not present

## 2022-04-01 DIAGNOSIS — M810 Age-related osteoporosis without current pathological fracture: Secondary | ICD-10-CM | POA: Diagnosis not present

## 2022-04-01 DIAGNOSIS — Z7901 Long term (current) use of anticoagulants: Secondary | ICD-10-CM | POA: Diagnosis not present

## 2022-04-01 DIAGNOSIS — G2581 Restless legs syndrome: Secondary | ICD-10-CM | POA: Diagnosis not present

## 2022-04-18 ENCOUNTER — Other Ambulatory Visit: Payer: Self-pay | Admitting: Internal Medicine

## 2022-04-23 NOTE — Progress Notes (Unsigned)
Electrophysiology Office Note Date: 04/23/2022  ID:  [name], [place] 12-Oct-1935, MRN [id number]  PCP: Lavone Orn, MD Primary Cardiologist: Candee Furbish, MD Electrophysiologist: [name], MD   CC: Pacemaker follow-up  [name] is a 86 y.o. female seen today for [name], MD for routine electrophysiology followup. Since last being seen in our clinic the patient reports doing well overall. She does have some fatigue with ADLs. Feels like she "runs out of steam".  she denies chest pain, palpitations, dyspnea, PND, orthopnea, nausea, vomiting, dizziness, syncope, edema, weight gain, or early satiety.   Device History: Engineer, agricultural PPM implanted 2021 for SSS/Tach-brady  Past Medical History:  Diagnosis Date   Ankle tendinitis    Breast cancer ([place])    Breast cancer, left breast ([place]) 08/09/2012   Gall stones    Hip bursitis    Hypertension    Menorrhagia    Migraine    Osteopenia 03/27/2014   Osteoporosis    Personal history of radiation therapy 2012   Left Breast Cancer   Plantar fasciitis    RLS (restless legs syndrome)    Shortness of breath    once a year- gets checked by Dr.   [name] incontinence    Trigger finger    CTS   Past Surgical History:  Procedure Laterality Date   ABDOMINAL HYSTERECTOMY     Ovaries retained   BLADDER SURGERY     Bladder Tack   BREAST LUMPECTOMY Left 2012   BREAST MASS EXCISION     Benign lump removal   CARPAL TUNNEL RELEASE Right 01/11/2013   Procedure: CARPAL TUNNEL RELEASE, RELEASE A-1 PULLEY RIGHT INDEX FINGER;  Surgeon: Cammie Sickle., MD;  Location: [facility];  Service: Orthopedics;  Laterality: Right;   CHOLECYSTECTOMY     PACEMAKER IMPLANT N/A 12/22/2019   Procedure: PACEMAKER IMPLANT;  Surgeon: Evans Lance, MD;  Location: [facility];  Service: Cardiovascular;  Laterality: N/A;   SHOULDER SURGERY Bilateral     Current Outpatient Medications   Medication Sig Dispense Refill   furosemide (LASIX) 20 MG tablet Take 1 tablet by mouth once daily 90 tablet 3   acetaminophen (TYLENOL) 500 MG tablet Take 500-1,000 mg by mouth every 6 (six) hours as needed (for pain.).     apixaban (ELIQUIS) 5 MG TABS tablet Take 1 tablet by mouth twice daily 60 tablet 6   KLOR-CON M20 20 MEQ tablet Take 20 mEq by mouth daily.      metoprolol succinate (TOPROL-XL) 25 MG 24 hr tablet Take 1 tablet (25 mg total) by mouth 2 (two) times daily. 180 tablet 2   metroNIDAZOLE (METROCREAM) 0.75 % cream Apply 1 application topically every other day.     No current facility-administered medications for this visit.    Allergies:   Amoxicillin and Iodine   Social History: Social History   Socioeconomic History   Marital status: Married    Spouse name: Not on file   Number of children: Not on file   Years of education: Not on file   Highest education level: Not on file  Occupational History   Not on file  Tobacco Use   Smoking status: Never   Smokeless tobacco: Never  Substance and Sexual Activity   Alcohol use: No   Drug use: No   Sexual activity: Never    Birth control/protection: Post-menopausal  Other Topics Concern   Not on file  Social History Narrative  Not on file   Social Determinants of Health   Financial Resource Strain: Not on file  Food Insecurity: Not on file  Transportation Needs: Not on file  Physical Activity: Not on file  Stress: Not on file  Social Connections: Not on file  Intimate Partner Violence: Not on file    Family History: Family History  Problem Relation Age of Onset   Heart Problems Mother    Hypertension Mother    Diabetes Father    Cancer Sister        Lung and Ovarian   Diabetes Sister    Heart Problems Brother    Diabetes Brother    Diabetes Sister    Diabetes Sister    Breast cancer Maternal Grandmother      Review of Systems: All other systems reviewed and are otherwise negative except as  noted above.  Physical Exam: There were no vitals filed for this visit.   GEN- The patient is well appearing, alert and oriented x 3 today.   HEENT: normocephalic, atraumatic; sclera clear, conjunctiva pink; hearing intact; oropharynx clear; neck supple, no JVP Lymph- no cervical lymphadenopathy Lungs- Clear to ausculation bilaterally, normal work of breathing.  No wheezes, rales, rhonchi Heart- Regular  rate and rhythm, no murmurs, rubs or gallops, PMI not laterally displaced GI- soft, non-tender, non-distended, bowel sounds present, no hepatosplenomegaly Extremities- no clubbing or cyanosis. No peripheral edema; DP/PT/radial pulses 2+ bilaterally MS- no significant deformity or atrophy Skin- warm and dry, no rash or lesion; PPM pocket well healed Psych- euthymic mood, full affect Neuro- strength and sensation are intact  PPM Interrogation-  reviewed in detail today,  See PACEART report.  EKG:  EKG is ordered today. Personal review of ekg ordered today shows NSR at 64 bpm   Recent Labs: No results found for requested labs within last 365 days.   Wt Readings from Last 3 Encounters:  11/25/21 182 lb (82.6 kg)  04/02/21 183 lb 9.6 oz (83.3 kg)  11/28/20 174 lb 12.8 oz (79.3 kg)     Other studies Reviewed: Additional studies/ records that were reviewed today include: Previous EP office notes, Previous remote checks, Most recent labwork.    Assessment and Plan:  1. SSS s/p [employer] PPM  Normal PPM function See Claudia Desanctis Art report Her Histograms are left shifted, almost primarily 60-70s, and she has fatigue with ADLs. Rate response turned on.   2. PAF Burden <1% Continue eliquis Labs today  3. HTN Stable on current regimen   Current medicines are reviewed at length with the patient today.     Disposition:   Follow up with Dr. Lovena Le in 12 months    Signed, Shirley Friar, PA-C  04/23/2022 10:44 AM  [facility] [address] [place]  [place] [address] [phone] (office) [phone] (fax)

## 2022-04-24 ENCOUNTER — Encounter: Payer: Self-pay | Admitting: Student

## 2022-04-24 ENCOUNTER — Ambulatory Visit: Payer: PPO | Attending: Student | Admitting: Student

## 2022-04-24 VITALS — BP 118/72 | HR 95 | Ht 64.0 in | Wt 183.2 lb

## 2022-04-24 DIAGNOSIS — Z950 Presence of cardiac pacemaker: Secondary | ICD-10-CM | POA: Diagnosis not present

## 2022-04-24 DIAGNOSIS — I495 Sick sinus syndrome: Secondary | ICD-10-CM | POA: Diagnosis not present

## 2022-04-24 DIAGNOSIS — I480 Paroxysmal atrial fibrillation: Secondary | ICD-10-CM

## 2022-04-24 LAB — CUP PACEART INCLINIC DEVICE CHECK
Date Time Interrogation Session: 20231207111857
Implantable Lead Connection Status: 753985
Implantable Lead Connection Status: 753985
Implantable Lead Implant Date: 20210805
Implantable Lead Implant Date: 20210805
Implantable Lead Location: 753859
Implantable Lead Location: 753860
Implantable Lead Model: 7841
Implantable Lead Model: 7842
Implantable Lead Serial Number: 1049950
Implantable Lead Serial Number: 1083642
Implantable Pulse Generator Implant Date: 20210805
Lead Channel Impedance Value: 551 Ohm
Lead Channel Impedance Value: 691 Ohm
Lead Channel Pacing Threshold Amplitude: 0.7 V
Lead Channel Pacing Threshold Amplitude: 0.9 V
Lead Channel Pacing Threshold Pulse Width: 0.4 ms
Lead Channel Pacing Threshold Pulse Width: 0.4 ms
Lead Channel Sensing Intrinsic Amplitude: 13.5 mV
Lead Channel Sensing Intrinsic Amplitude: 7.7 mV
Lead Channel Setting Pacing Amplitude: 1.3 V
Lead Channel Setting Pacing Amplitude: 2 V
Lead Channel Setting Pacing Pulse Width: 0.4 ms
Lead Channel Setting Sensing Sensitivity: 2.5 mV
Pulse Gen Serial Number: 942377
Zone Setting Status: 755011

## 2022-04-24 NOTE — Patient Instructions (Addendum)
Medication Instructions:  Your physician recommends that you continue on your current medications as directed. Please refer to the Current Medication list given to you today.  *If you need a refill on your cardiac medications before your next appointment, please call your pharmacy*  Lab Work: You will have blood work drawn today:  CBC and BMET    Testing/Procedures: None ordered.  Follow-Up: At [facility], you and your health needs are our priority.  As part of our continuing mission to provide you with exceptional heart care, we have created designated Provider Care Teams.  These Care Teams include your primary Cardiologist (physician) and Advanced Practice Providers (APPs -  Physician Assistants and Nurse Practitioners) who all work together to provide you with the care you need, when you need it.  We recommend signing up for the patient portal called "MyChart".  Sign up information is provided on this After Visit Summary.  MyChart is used to connect with patients for Virtual Visits (Telemedicine).  Patients are able to view lab/test results, encounter notes, upcoming appointments, etc.  Non-urgent messages can be sent to your provider as well.   To learn more about what you can do with MyChart, go to [email or web address].    Your next appointment:   You will follow up with Dr. [name] in 1 year(s)  The format for your next appointment:   In Person  Provider:   Legrand Como "Jonni Sanger" Chalmers Cater, PA-C  Remote monitoring is used to monitor your Pacemaker from home. This monitoring reduces the number of office visits required to check your device to one time per year. It allows [place] to keep an eye on the functioning of your device to ensure it is working properly. You are scheduled for a device check from home on 06/20/22. You may send your transmission at any time that day. If you have a wireless device, the transmission will be sent automatically. After your physician reviews your  transmission, you will receive a postcard with your next transmission date.  Important Information About Sugar

## 2022-04-25 ENCOUNTER — Telehealth: Payer: Self-pay | Admitting: Internal Medicine

## 2022-04-25 LAB — CBC WITH DIFFERENTIAL/PLATELET
Basophils Absolute: 0.1 10*3/uL (ref 0.0–0.2)
Basos: 1 %
EOS (ABSOLUTE): 0.1 10*3/uL (ref 0.0–0.4)
Eos: 2 %
Hematocrit: 41.4 % (ref 34.0–46.6)
Hemoglobin: 13.5 g/dL (ref 11.1–15.9)
Immature Grans (Abs): 0 10*3/uL (ref 0.0–0.1)
Immature Granulocytes: 0 %
Lymphocytes Absolute: 1.8 10*3/uL (ref 0.7–3.1)
Lymphs: 28 %
MCH: 29.3 pg (ref 26.6–33.0)
MCHC: 32.6 g/dL (ref 31.5–35.7)
MCV: 90 fL (ref 79–97)
Monocytes Absolute: 0.6 10*3/uL (ref 0.1–0.9)
Monocytes: 10 %
Neutrophils Absolute: 3.8 10*3/uL (ref 1.4–7.0)
Neutrophils: 59 %
Platelets: 289 10*3/uL (ref 150–450)
RBC: 4.6 x10E6/uL (ref 3.77–5.28)
RDW: 12.9 % (ref 11.7–15.4)
WBC: 6.4 10*3/uL (ref 3.4–10.8)

## 2022-04-25 LAB — BASIC METABOLIC PANEL
BUN/Creatinine Ratio: 18 (ref 12–28)
BUN: 17 mg/dL (ref 8–27)
CO2: 23 mmol/L (ref 20–29)
Calcium: 9.4 mg/dL (ref 8.7–10.3)
Chloride: 101 mmol/L (ref 96–106)
Creatinine, Ser: 0.95 mg/dL (ref 0.57–1.00)
Glucose: 106 mg/dL — ABNORMAL HIGH (ref 70–99)
Potassium: 4.2 mmol/L (ref 3.5–5.2)
Sodium: 138 mmol/L (ref 134–144)
eGFR: 59 mL/min/{1.73_m2} — ABNORMAL LOW (ref >59)

## 2022-04-25 NOTE — Telephone Encounter (Signed)
Patient states yesterday after her appointment she handed her after visit paperwork to someone, but never got it back. She would like to know how to go about retrieving this information.

## 2022-04-25 NOTE — Telephone Encounter (Signed)
Tried to call patient, no answer.

## 2022-04-29 NOTE — Telephone Encounter (Signed)
Pt stated she did not get paperwork back from lab draw.    Pt called / and shared AVS Info with patient;  Pt knows that will see Dr. Lovena Le in 1 year.  Pt told we will call her a few months prior to schedule appt.   Pt stated her BSX PPM feels off after her visit with Mr. Sula Rumple.  Pt states she feels her HR in her throat.    Appointment made with Mr. Sula Rumple on 05/09/22 at 46 am for PPM adjustment.

## 2022-05-07 NOTE — Progress Notes (Deleted)
Electrophysiology Office Note Date: 05/07/2022  ID:  [name], [place] 03/06/1936, MRN [id number]  PCP: Lavone Orn, MD Primary Cardiologist: Candee Furbish, MD Electrophysiologist: [name], MD   CC: Pacemaker follow-up  [name] is a 86 y.o. female seen today for [name], MD for routine electrophysiology followup.   At last visit she complained of some fatigue and ADLs. Rate response turned on to see if this helped. Since then, has felt off and felt her HR in her throat.   Since last being seen in our clinic the patient reports doing ***.  she denies chest pain, palpitations, dyspnea, PND, orthopnea, nausea, vomiting, dizziness, syncope, edema, weight gain, or early satiety.   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   [name] incontinence    Trigger finger    CTS   Past Surgical History:  Procedure Laterality Date   ABDOMINAL HYSTERECTOMY     Ovaries retained   BLADDER SURGERY     Bladder Tack   BREAST LUMPECTOMY Left 2012   BREAST MASS EXCISION     Benign lump removal   CARPAL TUNNEL RELEASE Right 01/11/2013   Procedure: CARPAL TUNNEL RELEASE, RELEASE A-1 PULLEY RIGHT INDEX FINGER;  Surgeon: Cammie Sickle., MD;  Location: [facility];  Service: Orthopedics;  Laterality: Right;   CHOLECYSTECTOMY     PACEMAKER IMPLANT N/A 12/22/2019   Procedure: PACEMAKER IMPLANT;  Surgeon: Evans Lance, MD;  Location: [facility];  Service: Cardiovascular;  Laterality:  N/A;   SHOULDER SURGERY Bilateral     Current Outpatient Medications  Medication Sig Dispense Refill   acetaminophen (TYLENOL) 500 MG tablet Take 500-1,000 mg by mouth every 6 (six) hours as needed (for pain.).     apixaban (ELIQUIS) 5 MG TABS tablet Take 1 tablet by mouth twice daily 60 tablet 6   furosemide (LASIX) 20 MG tablet Take 1 tablet by mouth once daily 90 tablet 3   KLOR-CON M20 20 MEQ tablet Take 20 mEq by mouth daily.      metoprolol succinate (TOPROL-XL) 25 MG 24 hr tablet Take 1 tablet (25 mg total) by mouth 2 (two) times daily. 180 tablet 2   metroNIDAZOLE (METROCREAM) 0.75 % cream Apply 1 application topically every other day.     No current facility-administered medications for this visit.    Allergies:   Amoxicillin and Iodine   Social History: Social History   Socioeconomic History   Marital status: Married    Spouse name: Not on file   Number of children: Not on file   Years of education: Not on file   Highest education level: Not on file  Occupational History   Not on file  Tobacco Use   Smoking status: Never   Smokeless tobacco: Never  Substance and Sexual Activity   Alcohol use: No   Drug use: No   Sexual activity:  Never    Birth control/protection: Post-menopausal  Other Topics Concern   Not on file  Social History Narrative   Not on file   Social Determinants of Health   Financial Resource Strain: Not on file  Food Insecurity: Not on file  Transportation Needs: Not on file  Physical Activity: Not on file  Stress: Not on file  Social Connections: Not on file  Intimate Partner Violence: Not on file    Family History: Family History  Problem Relation Age of Onset   Heart Problems Mother    Hypertension Mother    Diabetes Father    Cancer Sister        Lung and Ovarian   Diabetes Sister    Heart Problems Brother    Diabetes Brother    Diabetes Sister    Diabetes Sister    Breast cancer Maternal Grandmother      Review of  Systems: All other systems reviewed and are otherwise negative except as noted above.  Physical Exam: There were no vitals filed for this visit.   GEN- The patient is well appearing, alert and oriented x 3 today.   HEENT: normocephalic, atraumatic; sclera clear, conjunctiva pink; hearing intact; oropharynx clear; neck supple, no JVP Lymph- no cervical lymphadenopathy Lungs- Clear to ausculation bilaterally, normal work of breathing.  No wheezes, rales, rhonchi Heart- {Blank single:19197::"Regular","Irregularly irregular"}  rate and rhythm, no murmurs, rubs or gallops, PMI not laterally displaced GI- soft, non-tender, non-distended, bowel sounds present, no hepatosplenomegaly Extremities- no clubbing or cyanosis. {EDEMA XTGGY:69485} peripheral edema; DP/PT/radial pulses 2+ bilaterally MS- no significant deformity or atrophy Skin- warm and dry, no rash or lesion; PPM pocket well healed Psych- euthymic mood, full affect Neuro- strength and sensation are intact  PPM Interrogation-  reviewed in detail today,  See PACEART report.  EKG:  EKG is not ordered today.  Recent Labs: 04/24/2022: BUN 17; Creatinine, Ser 0.95; Hemoglobin 13.5; Platelets 289; Potassium 4.2; Sodium 138   Wt Readings from Last 3 Encounters:  04/24/22 183 lb 3.2 oz (83.1 kg)  11/25/21 182 lb (82.6 kg)  04/02/21 183 lb 9.6 oz (83.3 kg)     Other studies Reviewed: Additional studies/ records that were reviewed today include: Previous EP office notes, Previous remote checks, Most recent labwork.   {Select studies to display:26339}  Assessment and Plan:  1. Sick sinus syndrome s/p [employer] PPM  Normal PPM function See Claudia Desanctis Art report No changes today  2. PAF Burden low overall Continue eliquis  3. HTN Stable on current regimen   Current medicines are reviewed at length with the patient today.    Labs/ tests ordered today include: *** No orders of the defined types were placed in this  encounter.    Disposition:   Follow up with {EPPROVIDERS:28135} in {Blank single:19197::"2 weeks","4 weeks","3 months","6 months","12 months","as usual post gen change"}    Signed, Annamaria Helling  05/07/2022 9:27 AM  [facility] [address] [place] [address] [phone] (office) [phone] (fax)

## 2022-05-09 ENCOUNTER — Ambulatory Visit: Payer: PPO | Admitting: Student

## 2022-05-28 ENCOUNTER — Other Ambulatory Visit: Payer: Self-pay | Admitting: Internal Medicine

## 2022-05-28 DIAGNOSIS — I480 Paroxysmal atrial fibrillation: Secondary | ICD-10-CM

## 2022-05-30 DIAGNOSIS — E559 Vitamin D deficiency, unspecified: Secondary | ICD-10-CM | POA: Diagnosis not present

## 2022-06-04 NOTE — Progress Notes (Signed)
Electrophysiology Office Note Date: 06/09/2022  ID:  [name], [place] [birth year], MRN [id number]  PCP: Corliss Blacker, MD Primary Cardiologist: Candee Furbish, MD Electrophysiologist: [name], MD   CC: Pacemaker follow-up  [name] is a 87 y.o. female seen today for [name], MD for routine electrophysiology followup.   At last visit she complained of some fatigue and ADLs. Rate response turned on to see if this helped. Since then, has felt off and felt her HR in her throat.   Since last being seen in our clinic the patient reports doing about the same from a fatigue standpoint, if not worse.  She has had the sensation of tachy-palpitations with activity, this is new since rate response was turned on.   On 1/13 she was seated in her living room when she had a brief moment of dizziness and a dark line across her vision. She denies syncope. She thinks this occurred between 1030 and 1130, but had an NSVT event around 0845. She cannot be completely sure if it didn't happen around that time.  Otherwise doing OK. 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   [name] incontinence    Trigger finger    CTS   Past Surgical History:  Procedure Laterality Date   ABDOMINAL HYSTERECTOMY     Ovaries retained   BLADDER SURGERY     Bladder Tack   BREAST LUMPECTOMY Left 2012   BREAST MASS EXCISION     Benign lump removal   CARPAL TUNNEL  RELEASE Right 01/11/2013   Procedure: CARPAL TUNNEL RELEASE, RELEASE A-1 PULLEY RIGHT INDEX FINGER;  Surgeon: Cammie Sickle., MD;  Location: [facility];  Service: Orthopedics;  Laterality: Right;   CHOLECYSTECTOMY     PACEMAKER IMPLANT N/A 12/22/2019   Procedure: PACEMAKER IMPLANT;  Surgeon: Evans Lance, MD;  Location: [facility];  Service: Cardiovascular;  Laterality: N/A;   SHOULDER SURGERY Bilateral     Current Outpatient Medications  Medication Sig Dispense Refill   acetaminophen (TYLENOL) 500 MG tablet Take 500-1,000 mg by mouth every 6 (six) hours as needed (for pain.).     apixaban (ELIQUIS) 5 MG TABS tablet Take 1 tablet by mouth twice daily 180 tablet 1   furosemide (LASIX) 20 MG tablet Take 1 tablet by mouth once daily 90 tablet 3   KLOR-CON M20 20 MEQ tablet Take 20 mEq by mouth daily.      metoprolol succinate (TOPROL-XL) 25 MG 24 hr tablet Take 1 tablet (25 mg total) by mouth 2 (two) times daily. 180 tablet 2   metroNIDAZOLE (METROCREAM) 0.75 % cream Apply 1 application topically every other day.     No current facility-administered medications for this visit.    Allergies:   Amoxicillin and Iodine   Social History: Social History   Socioeconomic History  Marital status: Married    Spouse name: Not on file   Number of children: Not on file   Years of education: Not on file   Highest education level: Not on file  Occupational History   Not on file  Tobacco Use   Smoking status: Never   Smokeless tobacco: Never  Substance and Sexual Activity   Alcohol use: No   Drug use: No   Sexual activity: Never    Birth control/protection: Post-menopausal  Other Topics Concern   Not on file  Social History Narrative   Not on file   Social Determinants of Health   Financial Resource Strain: Not on file  Food Insecurity: Not on file  Transportation Needs: Not on file  Physical Activity: Not on file  Stress: Not on file  Social  Connections: Not on file  Intimate Partner Violence: Not on file    Family History: Family History  Problem Relation Age of Onset   Heart Problems Mother    Hypertension Mother    Diabetes Father    Cancer Sister        Lung and Ovarian   Diabetes Sister    Heart Problems Brother    Diabetes Brother    Diabetes Sister    Diabetes Sister    Breast cancer Maternal Grandmother      Review of Systems: All other systems reviewed and are otherwise negative except as noted above.  Physical Exam: Vitals:   06/09/22 1053 06/09/22 1100  BP: (!) 160/84 (!) 140/68  Pulse: 62   SpO2: 97%   Weight: 182 lb (82.6 kg)   Height: '5\' 4"'$  (1.626 m)      GEN- The patient is well appearing, alert and oriented x 3 today.   HEENT: normocephalic, atraumatic; sclera clear, conjunctiva pink; hearing intact; oropharynx clear; neck supple, no JVP Lymph- no cervical lymphadenopathy Lungs- Clear to ausculation bilaterally, normal work of breathing.  No wheezes, rales, rhonchi Heart- Regular  rate and rhythm, no murmurs, rubs or gallops, PMI not laterally displaced GI- soft, non-tender, non-distended, bowel sounds present, no hepatosplenomegaly Extremities- no clubbing or cyanosis. No peripheral edema; DP/PT/radial pulses 2+ bilaterally MS- no significant deformity or atrophy Skin- warm and dry, no rash or lesion; PPM pocket well healed Psych- euthymic mood, full affect Neuro- strength and sensation are intact  PPM Interrogation-  reviewed in detail today,  See PACEART report.  EKG:  EKG is not ordered today.  Recent Labs: 04/24/2022: BUN 17; Creatinine, Ser 0.95; Hemoglobin 13.5; Platelets 289; Potassium 4.2; Sodium 138   Wt Readings from Last 3 Encounters:  06/09/22 182 lb (82.6 kg)  04/24/22 183 lb 3.2 oz (83.1 kg)  11/25/21 182 lb (82.6 kg)     Other studies Reviewed: Additional studies/ records that were reviewed today include: Previous EP office notes, Previous remote checks, Most  recent labwork.   Assessment and Plan:  1. Sick sinus syndrome s/p [employer] PPM  Normal PPM function See Claudia Desanctis Art report No changes today  2. PAF Burden low overall Continue eliquis  3. HTN Stable on current regimen   4. NSVT Update Echo Increase toprol to 50 mg BID  5. Fatigue Multifactorial. Labs today.  She does not feel like rate response has helped and does not like the sensation of increased pacing. We will turn RR back off.   Current medicines are reviewed at length with the patient today.     Disposition:   Follow up with Dr. Lovena Le in 6  months, Soon[place] with abnormal work up.     Jacalyn Lefevre, PA-C  06/09/2022 10:58 AM  [facility] [address] [place] [address] [phone] (office) [phone] (fax)

## 2022-06-09 ENCOUNTER — Encounter: Payer: Self-pay | Admitting: Student

## 2022-06-09 ENCOUNTER — Ambulatory Visit: Payer: PPO | Attending: Student | Admitting: Student

## 2022-06-09 VITALS — BP 140/68 | HR 62 | Ht 64.0 in | Wt 182.0 lb

## 2022-06-09 DIAGNOSIS — I495 Sick sinus syndrome: Secondary | ICD-10-CM

## 2022-06-09 DIAGNOSIS — I480 Paroxysmal atrial fibrillation: Secondary | ICD-10-CM

## 2022-06-09 DIAGNOSIS — Z950 Presence of cardiac pacemaker: Secondary | ICD-10-CM

## 2022-06-09 DIAGNOSIS — R0600 Dyspnea, unspecified: Secondary | ICD-10-CM | POA: Diagnosis not present

## 2022-06-09 LAB — CUP PACEART INCLINIC DEVICE CHECK
Date Time Interrogation Session: 20240122112808
Implantable Lead Connection Status: 753985
Implantable Lead Connection Status: 753985
Implantable Lead Implant Date: 20210805
Implantable Lead Implant Date: 20210805
Implantable Lead Location: 753859
Implantable Lead Location: 753860
Implantable Lead Model: 7841
Implantable Lead Model: 7842
Implantable Lead Serial Number: 1049950
Implantable Lead Serial Number: 1083642
Implantable Pulse Generator Implant Date: 20210805
Lead Channel Impedance Value: 551 Ohm
Lead Channel Impedance Value: 692 Ohm
Lead Channel Pacing Threshold Amplitude: 0.6 V
Lead Channel Pacing Threshold Amplitude: 0.8 V
Lead Channel Pacing Threshold Pulse Width: 0.4 ms
Lead Channel Pacing Threshold Pulse Width: 0.4 ms
Lead Channel Sensing Intrinsic Amplitude: 5 mV
Lead Channel Sensing Intrinsic Amplitude: 9.2 mV
Lead Channel Setting Pacing Amplitude: 1.2 V
Lead Channel Setting Pacing Amplitude: 2 V
Lead Channel Setting Pacing Pulse Width: 0.4 ms
Lead Channel Setting Sensing Sensitivity: 2.5 mV
Pulse Gen Serial Number: 942377
Zone Setting Status: 755011

## 2022-06-09 MED ORDER — METOPROLOL SUCCINATE ER 50 MG PO TB24: 50.0000 mg | ORAL_TABLET | Freq: Two times a day (BID) | ORAL | 3 refills | Status: DC

## 2022-06-09 NOTE — Patient Instructions (Signed)
Medication Instructions:  Your physician has recommended you make the following change in your medication:   INCREASE: Metoprolol to '50mg'$  twice daily  *If you need a refill on your cardiac medications before your next appointment, please call your pharmacy*   Lab Work: TODAY: BMET, Mag, CBC  If you have labs (blood work) drawn today and your tests are completely normal, you will receive your results only by: [facility] (if you have MyChart) OR A paper copy in the mail If you have any lab test that is abnormal or we need to change your treatment, we will call you to review the results.   Testing/Procedures: Your physician has requested that you have an echocardiogram. Echocardiography is a painless test that uses sound waves to create images of your heart. It provides your doctor with information about the size and shape of your heart and how well your heart's chambers and valves are working. This procedure takes approximately one hour. There are no restrictions for this procedure. Please do NOT wear cologne, perfume, aftershave, or lotions (deodorant is allowed). Please arrive 15 minutes prior to your appointment time.   Follow-Up: At [facility], you and your health needs are our priority.  As part of our continuing mission to provide you with exceptional heart care, we have created designated Provider Care Teams.  These Care Teams include your primary Cardiologist (physician) and Advanced Practice Providers (APPs -  Physician Assistants and Nurse Practitioners) who all work together to provide you with the care you need, when you need it.  We recommend signing up for the patient portal called "MyChart".  Sign up information is provided on this After Visit Summary.  MyChart is used to connect with patients for Virtual Visits (Telemedicine).  Patients are able to view lab/test results, encounter notes, upcoming appointments, etc.  Non-urgent messages can be sent to your  provider as well.   To learn more about what you can do with MyChart, go to [email or web address].    Your next appointment:   6 month(s)  Provider:   [name], MD

## 2022-06-10 LAB — CBC
Hematocrit: 39.7 % (ref 34.0–46.6)
Hemoglobin: 12.9 g/dL (ref 11.1–15.9)
MCH: 29.5 pg (ref 26.6–33.0)
MCHC: 32.5 g/dL (ref 31.5–35.7)
MCV: 91 fL (ref 79–97)
Platelets: 295 10*3/uL (ref 150–450)
RBC: 4.37 x10E6/uL (ref 3.77–5.28)
RDW: 12.9 % (ref 11.7–15.4)
WBC: 6.8 10*3/uL (ref 3.4–10.8)

## 2022-06-10 LAB — BASIC METABOLIC PANEL
BUN/Creatinine Ratio: 16 (ref 12–28)
BUN: 15 mg/dL (ref 8–27)
CO2: 24 mmol/L (ref 20–29)
Calcium: 9.1 mg/dL (ref 8.7–10.3)
Chloride: 102 mmol/L (ref 96–106)
Creatinine, Ser: 0.92 mg/dL (ref 0.57–1.00)
Glucose: 105 mg/dL — ABNORMAL HIGH (ref 70–99)
Potassium: 4.6 mmol/L (ref 3.5–5.2)
Sodium: 139 mmol/L (ref 134–144)
eGFR: 61 mL/min/{1.73_m2} (ref >59)

## 2022-06-10 LAB — MAGNESIUM: Magnesium: 2.3 mg/dL (ref 1.6–2.3)

## 2022-06-20 ENCOUNTER — Ambulatory Visit: Payer: PPO

## 2022-06-20 DIAGNOSIS — I495 Sick sinus syndrome: Secondary | ICD-10-CM | POA: Diagnosis not present

## 2022-06-20 LAB — CUP PACEART REMOTE DEVICE CHECK
Battery Remaining Longevity: 138 mo
Battery Remaining Percentage: 100 %
Brady Statistic RA Percent Paced: 63 %
Brady Statistic RV Percent Paced: 1 %
Date Time Interrogation Session: 20240202051200
Implantable Lead Connection Status: 753985
Implantable Lead Connection Status: 753985
Implantable Lead Implant Date: 20210805
Implantable Lead Implant Date: 20210805
Implantable Lead Location: 753859
Implantable Lead Location: 753860
Implantable Lead Model: 7841
Implantable Lead Model: 7842
Implantable Lead Serial Number: 1049950
Implantable Lead Serial Number: 1083642
Implantable Pulse Generator Implant Date: 20210805
Lead Channel Impedance Value: 527 Ohm
Lead Channel Impedance Value: 680 Ohm
Lead Channel Pacing Threshold Amplitude: 0.7 V
Lead Channel Pacing Threshold Amplitude: 0.8 V
Lead Channel Pacing Threshold Pulse Width: 0.4 ms
Lead Channel Pacing Threshold Pulse Width: 0.4 ms
Lead Channel Setting Pacing Amplitude: 1.3 V
Lead Channel Setting Pacing Amplitude: 2 V
Lead Channel Setting Pacing Pulse Width: 0.4 ms
Lead Channel Setting Sensing Sensitivity: 2.5 mV
Pulse Gen Serial Number: 942377
Zone Setting Status: 755011

## 2022-07-03 ENCOUNTER — Ambulatory Visit (HOSPITAL_COMMUNITY): Payer: PPO | Attending: Student

## 2022-07-03 DIAGNOSIS — R0600 Dyspnea, unspecified: Secondary | ICD-10-CM | POA: Diagnosis present

## 2022-07-03 DIAGNOSIS — R0609 Other forms of dyspnea: Secondary | ICD-10-CM

## 2022-07-03 DIAGNOSIS — I480 Paroxysmal atrial fibrillation: Secondary | ICD-10-CM | POA: Diagnosis not present

## 2022-07-03 LAB — ECHOCARDIOGRAM COMPLETE
Area-P 1/2: 2.63 cm2
S' Lateral: 1.9 cm

## 2022-07-08 NOTE — Progress Notes (Signed)
Remote pacemaker transmission.   

## 2022-07-15 ENCOUNTER — Other Ambulatory Visit: Payer: Self-pay | Admitting: Internal Medicine

## 2022-07-15 DIAGNOSIS — Z1231 Encounter for screening mammogram for malignant neoplasm of breast: Secondary | ICD-10-CM

## 2022-08-29 ENCOUNTER — Ambulatory Visit
Admission: RE | Admit: 2022-08-29 | Discharge: 2022-08-29 | Disposition: A | Discharge status: Home / Self Care | Payer: PPO | Source: Ambulatory Visit | Attending: Internal Medicine | Admitting: Internal Medicine

## 2022-08-29 DIAGNOSIS — Z1231 Encounter for screening mammogram for malignant neoplasm of breast: Secondary | ICD-10-CM

## 2022-09-19 ENCOUNTER — Ambulatory Visit (INDEPENDENT_AMBULATORY_CARE_PROVIDER_SITE_OTHER): Payer: PPO

## 2022-09-19 DIAGNOSIS — I495 Sick sinus syndrome: Secondary | ICD-10-CM

## 2022-09-19 LAB — CUP PACEART REMOTE DEVICE CHECK
Battery Remaining Longevity: 150 mo
Battery Remaining Percentage: 100 %
Brady Statistic RA Percent Paced: 60 %
Brady Statistic RV Percent Paced: 4 %
Date Time Interrogation Session: 20240503043400
Implantable Lead Connection Status: 753985
Implantable Lead Connection Status: 753985
Implantable Lead Implant Date: 20210805
Implantable Lead Implant Date: 20210805
Implantable Lead Location: 753859
Implantable Lead Location: 753860
Implantable Lead Model: 7841
Implantable Lead Model: 7842
Implantable Lead Serial Number: 1049950
Implantable Lead Serial Number: 1083642
Implantable Pulse Generator Implant Date: 20210805
Lead Channel Impedance Value: 523 Ohm
Lead Channel Impedance Value: 677 Ohm
Lead Channel Pacing Threshold Amplitude: 0.7 V
Lead Channel Pacing Threshold Amplitude: 0.7 V
Lead Channel Pacing Threshold Pulse Width: 0.4 ms
Lead Channel Pacing Threshold Pulse Width: 0.4 ms
Lead Channel Setting Pacing Amplitude: 1.2 V
Lead Channel Setting Pacing Amplitude: 2 V
Lead Channel Setting Pacing Pulse Width: 0.4 ms
Lead Channel Setting Sensing Sensitivity: 2.5 mV
Pulse Gen Serial Number: 942377
Zone Setting Status: 755011

## 2022-10-08 NOTE — Progress Notes (Signed)
Remote pacemaker transmission.   

## 2022-11-18 ENCOUNTER — Other Ambulatory Visit: Payer: Self-pay | Admitting: Internal Medicine

## 2022-11-18 DIAGNOSIS — I480 Paroxysmal atrial fibrillation: Secondary | ICD-10-CM

## 2022-11-18 NOTE — Telephone Encounter (Signed)
Eliquis 5mg  refill request received. Patient is 87 years old, weight-82.6kg, Crea-0.92 on 06/09/22, Diagnosis-Afib, and last seen by Otilio Saber on 06/09/22. Dose is appropriate based on dosing criteria. Will send in refill to requested pharmacy.

## 2022-11-24 ENCOUNTER — Encounter: Payer: Self-pay | Admitting: Cardiology

## 2022-11-24 ENCOUNTER — Ambulatory Visit: Payer: PPO | Attending: Cardiology | Admitting: Cardiology

## 2022-11-24 VITALS — BP 156/70 | HR 60 | Ht 64.0 in | Wt 184.6 lb

## 2022-11-24 DIAGNOSIS — Z950 Presence of cardiac pacemaker: Secondary | ICD-10-CM | POA: Diagnosis not present

## 2022-11-24 DIAGNOSIS — I495 Sick sinus syndrome: Secondary | ICD-10-CM

## 2022-11-24 DIAGNOSIS — I480 Paroxysmal atrial fibrillation: Secondary | ICD-10-CM | POA: Diagnosis not present

## 2022-11-24 NOTE — Patient Instructions (Signed)
Medication Instructions:  The current medical regimen is effective;  continue present plan and medications.  *If you need a refill on your cardiac medications before your next appointment, please call your pharmacy*  Follow-Up: At [facility], you and your health needs are our priority.  As part of our continuing mission to provide you with exceptional heart care, we have created designated Provider Care Teams.  These Care Teams include your primary Cardiologist (physician) and Advanced Practice Providers (APPs -  Physician Assistants and Nurse Practitioners) who all work together to provide you with the care you need, when you need it.  We recommend signing up for the patient portal called "MyChart".  Sign up information is provided on this After Visit Summary.  MyChart is used to connect with patients for Virtual Visits (Telemedicine).  Patients are able to view lab/test results, encounter notes, upcoming appointments, etc.  Non-urgent messages can be sent to your provider as well.   To learn more about what you can do with MyChart, go to [email or web address].    Your next appointment:   1 year(s)  Provider:   Mark Skains, MD      

## 2022-11-24 NOTE — Progress Notes (Signed)
  Cardiology Office Note:  .   Date:  11/24/2022  ID:  [name], DOB 02-Aug-1935, MRN [id number] PCP: Joya Martyr, MD  [facility] Providers Cardiologist:  Donato Schultz, MD Electrophysiologist:  Lewayne Bunting, MD    History of Present Illness: .   [name] is a 87 y.o. female here for the follow-up of pacemaker, sick sinus syndrome, [employer] pacer implanted 20 5:21 second pause, paroxysmal atrial fibrillation, hypertension, NSVT, fatigue.  Last note reviewed from [facility].  Does not feel that the rate response has helped therefore it was turned back off.  She has very low burden of atrial fibrillation.  No bleeding no syncope.  Brief dizziness at times.  Yesterday minor palps.  Enjoys ice cream at night.  Widowed.  Mother died with MI, brother died with MI, sister atrial fibrillation.  ROS: No fevers chills nausea vomiting  Studies Reviewed: .        Echo 2021-EF 65% grade 1 diastolic dysfunction  Nuclear stress test 2021-normal hyperdynamic.  No ischemia. Risk Assessment/Calculations:    CHA2DS2-VASc Score = 3   This indicates a 3.2% annual risk of stroke. The patient's score is based upon: CHF History: 0 HTN History: 0 Diabetes History: 0 Stroke History: 0 Vascular Disease History: 0 Age Score: 2 Gender Score: 1           Physical Exam:   VS:  BP (!) 156/70   Pulse 60   Ht 5\' 4"  (1.626 m)   Wt 184 lb 9.6 oz (83.7 kg)   SpO2 96%   BMI 31.69 kg/m    Wt Readings from Last 3 Encounters:  11/24/22 184 lb 9.6 oz (83.7 kg)  06/09/22 182 lb (82.6 kg)  04/24/22 183 lb 3.2 oz (83.1 kg)    GEN: Well nourished, well developed in no acute distress NECK: No JVD; No carotid bruits CARDIAC: RRR, no murmurs, rubs, gallops RESPIRATORY:  Clear to auscultation without rales, wheezing or rhonchi  ABDOMEN: Soft, non-tender, non-distended EXTREMITIES:  No edema; No deformity   ASSESSMENT AND PLAN: .    Paroxysmal atrial fibrillation  (HCC) On Eliquis 5 mg twice a day for anticoagulation prescription drug management.  High risk medication is being monitored with blood work.  Previously had 3 hours of atrial fibrillation.  Most recent hemoglobin 12.7 creatinine 0.9.  No bleeding.  Rare palpitations.   Tachycardia-bradycardia syndrome (HCC) Permanent Saint Jude pacemaker in place.  Dr. Ladona Ridgel.  Notes were reviewed.  He is monitoring function closely.  Doing very well.  Prior 5-second pause.   Pacemaker [place] Jude.  Monitoring functioning.  No changes made.  Turned off rate response.  Elevated blood pressure without hypertension - 150s today.  She will check at home.  She will let us know.  She does take a Toprol 50 as well as Lasix 20       Dispo: 1 yr  Signed, Donato Schultz, MD

## 2022-12-09 ENCOUNTER — Encounter: Payer: Self-pay | Admitting: Internal Medicine

## 2022-12-09 ENCOUNTER — Ambulatory Visit: Payer: PPO | Admitting: Internal Medicine

## 2022-12-09 VITALS — BP 132/64 | HR 62 | Ht 64.0 in | Wt 183.6 lb

## 2022-12-09 DIAGNOSIS — Z950 Presence of cardiac pacemaker: Secondary | ICD-10-CM | POA: Diagnosis not present

## 2022-12-09 DIAGNOSIS — I495 Sick sinus syndrome: Secondary | ICD-10-CM | POA: Diagnosis not present

## 2022-12-09 NOTE — Progress Notes (Signed)
HPI [name] returns today for followup of sinus node dysfunction , s/p PPM insertion. In the interim, she has had minimal problems with palpitations and has been found to have atrial fib with a RVR. She has not had syncope. She denies chest pain or sob but does note palpitations. She has been on eliquis.  Allergies  Allergen Reactions   Amoxicillin Diarrhea    Per patient severe diarrhea required hospilization   Iodine Rash     Current Outpatient Medications  Medication Sig Dispense Refill   acetaminophen (TYLENOL) 500 MG tablet Take 500-1,000 mg by mouth every 6 (six) hours as needed (for pain.).     apixaban (ELIQUIS) 5 MG TABS tablet Take 1 tablet by mouth twice daily 180 tablet 1   cholecalciferol (VITAMIN D3) 25 MCG (1000 UNIT) tablet Take by mouth.     furosemide (LASIX) 20 MG tablet Take 1 tablet by mouth once daily 90 tablet 3   KLOR-CON M20 20 MEQ tablet Take 20 mEq by mouth daily.      metoprolol succinate (TOPROL-XL) 50 MG 24 hr tablet Take 1 tablet (50 mg total) by mouth 2 (two) times daily. 180 tablet 3   metroNIDAZOLE (METROCREAM) 0.75 % cream Apply 1 application topically every other day.     No current facility-administered medications for this visit.     Past Medical History:  Diagnosis Date   Ankle tendinitis    Breast cancer (HCC)    Breast cancer, left breast (HCC) 08/09/2012   Gall stones    Hip bursitis    Hypertension    Menorrhagia    Migraine    Osteopenia 03/27/2014   Osteoporosis    Personal history of radiation therapy 2012   Left Breast Cancer   Plantar fasciitis    RLS (restless legs syndrome)    Shortness of breath    once a year- gets checked by Dr.   Michel Santee incontinence    Trigger finger    CTS    ROS:   All systems reviewed and negative except as noted in the HPI.   Past Surgical History:  Procedure Laterality Date   ABDOMINAL HYSTERECTOMY     Ovaries retained   BLADDER SURGERY     Bladder Tack   BREAST LUMPECTOMY  Left 2012   BREAST MASS EXCISION     Benign lump removal   CARPAL TUNNEL RELEASE Right 01/11/2013   Procedure: CARPAL TUNNEL RELEASE, RELEASE A-1 PULLEY RIGHT INDEX FINGER;  Surgeon: Wyn Forster., MD;  Location: [facility];  Service: Orthopedics;  Laterality: Right;   CHOLECYSTECTOMY     PACEMAKER IMPLANT N/A 12/22/2019   Procedure: PACEMAKER IMPLANT;  Surgeon: Marinus Maw, MD;  Location: [facility];  Service: Cardiovascular;  Laterality: N/A;   SHOULDER SURGERY Bilateral      Family History  Problem Relation Age of Onset   Heart Problems Mother    Hypertension Mother    Diabetes Father    Cancer Sister        Lung and Ovarian   Diabetes Sister    Heart Problems Brother    Diabetes Brother    Diabetes Sister    Diabetes Sister    Breast cancer Maternal Grandmother      Social History   Socioeconomic History   Marital status: Widowed    Spouse name: Not on file   Number of children: Not on file   Years of education: Not on  file   Highest education level: Not on file  Occupational History   Not on file  Tobacco Use   Smoking status: Never   Smokeless tobacco: Never  Substance and Sexual Activity   Alcohol use: No   Drug use: No   Sexual activity: Never    Birth control/protection: Post-menopausal  Other Topics Concern   Not on file  Social History Narrative   Not on file   Social Determinants of Health   Financial Resource Strain: Not on file  Food Insecurity: Not on file  Transportation Needs: Not on file  Physical Activity: Not on file  Stress: Not on file  Social Connections: Not on file  Intimate Partner Violence: Not on file     BP 132/64   Pulse 62   Ht 5\' 4"  (1.626 m)   Wt 183 lb 9.6 oz (83.3 kg)   SpO2 96%   BMI 31.51 kg/m   Physical Exam:  Well appearing NAD HEENT: Unremarkable Neck:  No JVD, no thyromegally Lymphatics:  No adenopathy Back:  No CVA tenderness Lungs:  Clear with no wheezes HEART:   Regular rate rhythm, no murmurs, no rubs, no clicks Abd:  soft, positive bowel sounds, no organomegally, no rebound, no guarding Ext:  2 plus pulses, no edema, no cyanosis, no clubbing Skin:  No rashes no nodules Neuro:  CN II through XII intact, motor grossly intact  DEVICE  Normal device function.  See PaceArt for details.   Assess/Plan:  1. Sinus node dysfunction - she is asymptomatic, s/p PPM insertion. She is pacing over 50% in the atrium. 2. PPM - her St. Jude DDD PM is working normally. 3. PAF - she has tolerated her eliquis. She has rare palps.  4. HTN - her bp is well controlled.   Sharlot Gowda Kaitlyn Siedlecki,MD

## 2022-12-09 NOTE — Patient Instructions (Addendum)
Medication Instructions:  Your physician recommends that you continue on your current medications as directed. Please refer to the Current Medication list given to you today.  *If you need a refill on your cardiac medications before your next appointment, please call your pharmacy*  Lab Work: None ordered.  If you have labs (blood work) drawn today and your tests are completely normal, you will receive your results only by: MyChart Message (if you have MyChart) OR A paper copy in the mail If you have any lab test that is abnormal or we need to change your treatment, we will call you to review the results.  Testing/Procedures: None ordered.  Follow-Up: At [facility], you and your health needs are our priority.  As part of our continuing mission to provide you with exceptional heart care, we have created designated Provider Care Teams.  These Care Teams include your primary Cardiologist (physician) and Advanced Practice Providers (APPs -  Physician Assistants and Nurse Practitioners) who all work together to provide you with the care you need, when you need it.  We recommend signing up for the patient portal called "MyChart".  Sign up information is provided on this After Visit Summary.  MyChart is used to connect with patients for Virtual Visits (Telemedicine).  Patients are able to view lab/test results, encounter notes, upcoming appointments, etc.  Non-urgent messages can be sent to your provider as well.   To learn more about what you can do with MyChart, go to [email or web address].    Your next appointment:   1 year(s)  The format for your next appointment:   In Person  Provider:   Lewayne Bunting, MD{or one of the following Advanced Practice Providers on your designated Care Team:   Francis Dowse, [place] Casimiro Needle "Mardelle Matte" [place], [place] Earnest Rosier, NP  Remote monitoring is used to monitor your Pacemaker/ ICD from home. This monitoring reduces the number of office visits required  to check your device to one time per year. It allows [place] to keep an eye on the functioning of your device to ensure it is working properly. You are scheduled for a device check from home on 12/19/2022. You may send your transmission at any time that day. If you have a wireless device, the transmission will be sent automatically. After your physician reviews your transmission, you will receive a postcard with your next transmission date.  Important Information About Sugar

## 2022-12-14 ENCOUNTER — Other Ambulatory Visit: Payer: Self-pay | Admitting: Cardiology

## 2022-12-17 ENCOUNTER — Other Ambulatory Visit: Payer: Self-pay

## 2022-12-17 MED ORDER — FUROSEMIDE 20 MG PO TABS: 20.0000 mg | ORAL_TABLET | Freq: Every day | ORAL | 3 refills | Status: DC

## 2022-12-17 NOTE — Telephone Encounter (Signed)
Pt's medication was sent to pt's pharmacy as requested. Confirmation received.  °

## 2022-12-19 ENCOUNTER — Ambulatory Visit (INDEPENDENT_AMBULATORY_CARE_PROVIDER_SITE_OTHER): Payer: PPO

## 2022-12-19 DIAGNOSIS — I495 Sick sinus syndrome: Secondary | ICD-10-CM | POA: Diagnosis not present

## 2022-12-19 LAB — CUP PACEART REMOTE DEVICE CHECK
Battery Remaining Longevity: 114 mo
Battery Remaining Percentage: 100 %
Brady Statistic RA Percent Paced: 61 %
Brady Statistic RV Percent Paced: 5 %
Date Time Interrogation Session: 20240802035400
Lead Channel Impedance Value: 529 Ohm
Lead Channel Impedance Value: 697 Ohm
Lead Channel Pacing Threshold Amplitude: 0.7 V
Lead Channel Pacing Threshold Amplitude: 0.7 V
Lead Channel Pacing Threshold Pulse Width: 0.4 ms
Lead Channel Pacing Threshold Pulse Width: 0.4 ms
Lead Channel Setting Pacing Amplitude: 1.2 V
Lead Channel Setting Pacing Amplitude: 5 V
Lead Channel Setting Pacing Pulse Width: 0.4 ms
Lead Channel Setting Sensing Sensitivity: 2.5 mV
Pulse Gen Serial Number: 942377
Zone Setting Status: 755011

## 2022-12-29 NOTE — Progress Notes (Signed)
Remote pacemaker transmission.   

## 2023-03-20 ENCOUNTER — Ambulatory Visit (INDEPENDENT_AMBULATORY_CARE_PROVIDER_SITE_OTHER): Payer: PPO

## 2023-03-20 DIAGNOSIS — I495 Sick sinus syndrome: Secondary | ICD-10-CM | POA: Diagnosis not present

## 2023-03-20 LAB — CUP PACEART REMOTE DEVICE CHECK
Battery Remaining Longevity: 138 mo
Battery Remaining Percentage: 100 %
Brady Statistic RA Percent Paced: 63 %
Brady Statistic RV Percent Paced: 4 %
Date Time Interrogation Session: 20241101034100
Lead Channel Impedance Value: 549 Ohm
Lead Channel Impedance Value: 679 Ohm
Lead Channel Pacing Threshold Amplitude: 0.7 V
Lead Channel Pacing Threshold Amplitude: 0.7 V
Lead Channel Pacing Threshold Pulse Width: 0.4 ms
Lead Channel Pacing Threshold Pulse Width: 0.4 ms
Lead Channel Setting Pacing Amplitude: 1.2 V
Lead Channel Setting Pacing Amplitude: 2 V
Lead Channel Setting Pacing Pulse Width: 0.4 ms
Lead Channel Setting Sensing Sensitivity: 2.5 mV
Pulse Gen Serial Number: 942377
Zone Setting Status: 755011

## 2023-04-01 NOTE — Progress Notes (Signed)
Remote pacemaker transmission.   

## 2023-05-21 ENCOUNTER — Other Ambulatory Visit: Payer: Self-pay | Admitting: Internal Medicine

## 2023-05-21 DIAGNOSIS — I480 Paroxysmal atrial fibrillation: Secondary | ICD-10-CM

## 2023-05-21 NOTE — Telephone Encounter (Signed)
 Prescription refill request for Eliquis received. Indication: a fib Last office visit: 12/09/22 Scr: 0.92 epic 06/09/22 Age: 88 Weight: 83kg

## 2023-05-31 ENCOUNTER — Other Ambulatory Visit: Payer: Self-pay | Admitting: Internal Medicine

## 2023-05-31 DIAGNOSIS — I480 Paroxysmal atrial fibrillation: Secondary | ICD-10-CM

## 2023-06-02 ENCOUNTER — Other Ambulatory Visit: Payer: Self-pay | Admitting: Student

## 2023-06-02 DIAGNOSIS — I495 Sick sinus syndrome: Secondary | ICD-10-CM

## 2023-06-02 DIAGNOSIS — I480 Paroxysmal atrial fibrillation: Secondary | ICD-10-CM

## 2023-06-19 ENCOUNTER — Ambulatory Visit: Payer: PPO

## 2023-06-19 DIAGNOSIS — I495 Sick sinus syndrome: Secondary | ICD-10-CM

## 2023-06-22 LAB — CUP PACEART REMOTE DEVICE CHECK
Battery Remaining Longevity: 126 mo
Battery Remaining Percentage: 100 %
Brady Statistic RA Percent Paced: 64 %
Brady Statistic RV Percent Paced: 4 %
Date Time Interrogation Session: 20250131040700
Lead Channel Impedance Value: 549 Ohm
Lead Channel Impedance Value: 722 Ohm
Lead Channel Pacing Threshold Amplitude: 0.6 V
Lead Channel Pacing Threshold Amplitude: 0.7 V
Lead Channel Pacing Threshold Pulse Width: 0.4 ms
Lead Channel Pacing Threshold Pulse Width: 0.4 ms
Lead Channel Setting Pacing Amplitude: 1.2 V
Lead Channel Setting Pacing Amplitude: 2 V
Lead Channel Setting Pacing Pulse Width: 0.4 ms
Lead Channel Setting Sensing Sensitivity: 2.5 mV
Pulse Gen Serial Number: 942377
Zone Setting Status: 755011

## 2023-07-16 ENCOUNTER — Other Ambulatory Visit: Payer: Self-pay | Admitting: Internal Medicine

## 2023-07-16 DIAGNOSIS — Z0000 Encounter for general adult medical examination without abnormal findings: Secondary | ICD-10-CM

## 2023-07-24 NOTE — Addendum Note (Signed)
 Addended by: Elease Etienne A on: 07/24/2023 12:25 PM   Modules accepted: Orders

## 2023-07-24 NOTE — Progress Notes (Signed)
 Remote pacemaker transmission.

## 2023-08-04 ENCOUNTER — Other Ambulatory Visit: Payer: Self-pay | Admitting: Internal Medicine

## 2023-08-04 DIAGNOSIS — D6869 Other thrombophilia: Secondary | ICD-10-CM | POA: Diagnosis not present

## 2023-08-04 DIAGNOSIS — Z853 Personal history of malignant neoplasm of breast: Secondary | ICD-10-CM | POA: Diagnosis not present

## 2023-08-04 DIAGNOSIS — L719 Rosacea, unspecified: Secondary | ICD-10-CM | POA: Diagnosis not present

## 2023-08-04 DIAGNOSIS — E559 Vitamin D deficiency, unspecified: Secondary | ICD-10-CM | POA: Diagnosis not present

## 2023-08-04 DIAGNOSIS — M8580 Other specified disorders of bone density and structure, unspecified site: Secondary | ICD-10-CM | POA: Diagnosis not present

## 2023-08-04 DIAGNOSIS — I495 Sick sinus syndrome: Secondary | ICD-10-CM | POA: Diagnosis not present

## 2023-08-04 DIAGNOSIS — Z950 Presence of cardiac pacemaker: Secondary | ICD-10-CM | POA: Diagnosis not present

## 2023-08-04 DIAGNOSIS — I10 Essential (primary) hypertension: Secondary | ICD-10-CM | POA: Diagnosis not present

## 2023-08-04 DIAGNOSIS — N1831 Chronic kidney disease, stage 3a: Secondary | ICD-10-CM | POA: Diagnosis not present

## 2023-08-04 DIAGNOSIS — I480 Paroxysmal atrial fibrillation: Secondary | ICD-10-CM | POA: Diagnosis not present

## 2023-08-13 DIAGNOSIS — M2041 Other hammer toe(s) (acquired), right foot: Secondary | ICD-10-CM | POA: Diagnosis not present

## 2023-08-13 DIAGNOSIS — B351 Tinea unguium: Secondary | ICD-10-CM | POA: Diagnosis not present

## 2023-08-13 DIAGNOSIS — M79676 Pain in unspecified toe(s): Secondary | ICD-10-CM | POA: Diagnosis not present

## 2023-08-26 ENCOUNTER — Other Ambulatory Visit: Payer: Self-pay | Admitting: Internal Medicine

## 2023-08-26 DIAGNOSIS — I480 Paroxysmal atrial fibrillation: Secondary | ICD-10-CM

## 2023-08-26 NOTE — Telephone Encounter (Signed)
 Eliquis 5mg  refill request received. Patient is 87 years old, weight-83.3kg, Crea-0.98 on 02/09/23 via [facility] Everywhere from [place], [place], and last seen by Dr. Ladona Ridgel on 12/09/22. Dose is appropriate based on dosing criteria.   Refill last sent 05/21/23 90 day supply

## 2023-08-31 ENCOUNTER — Ambulatory Visit
Admission: RE | Admit: 2023-08-31 | Discharge: 2023-08-31 | Disposition: A | Discharge status: Home / Self Care | Payer: PPO | Source: Ambulatory Visit | Attending: Internal Medicine | Admitting: Internal Medicine

## 2023-08-31 DIAGNOSIS — Z0000 Encounter for general adult medical examination without abnormal findings: Secondary | ICD-10-CM

## 2023-08-31 DIAGNOSIS — Z1231 Encounter for screening mammogram for malignant neoplasm of breast: Secondary | ICD-10-CM | POA: Diagnosis not present

## 2023-09-18 ENCOUNTER — Ambulatory Visit (INDEPENDENT_AMBULATORY_CARE_PROVIDER_SITE_OTHER): Payer: PPO

## 2023-09-18 DIAGNOSIS — I495 Sick sinus syndrome: Secondary | ICD-10-CM

## 2023-09-18 LAB — CUP PACEART REMOTE DEVICE CHECK
Battery Remaining Longevity: 120 mo
Battery Remaining Percentage: 100 %
Brady Statistic RA Percent Paced: 65 %
Brady Statistic RV Percent Paced: 4 %
Date Time Interrogation Session: 20250502040700
Lead Channel Impedance Value: 546 Ohm
Lead Channel Impedance Value: 690 Ohm
Lead Channel Pacing Threshold Amplitude: 0.6 V
Lead Channel Pacing Threshold Amplitude: 0.7 V
Lead Channel Pacing Threshold Pulse Width: 0.4 ms
Lead Channel Pacing Threshold Pulse Width: 0.4 ms
Lead Channel Setting Pacing Amplitude: 1.2 V
Lead Channel Setting Pacing Amplitude: 4 V
Lead Channel Setting Pacing Pulse Width: 0.4 ms
Lead Channel Setting Sensing Sensitivity: 2.5 mV
Pulse Gen Serial Number: 942377
Zone Setting Status: 755011

## 2023-09-20 ENCOUNTER — Encounter: Payer: Self-pay | Admitting: Internal Medicine

## 2023-10-28 NOTE — Progress Notes (Signed)
 Remote pacemaker transmission.

## 2023-11-30 ENCOUNTER — Other Ambulatory Visit: Payer: Self-pay | Admitting: Cardiology

## 2023-12-02 ENCOUNTER — Other Ambulatory Visit: Payer: Self-pay | Admitting: Cardiology

## 2023-12-02 MED ORDER — FUROSEMIDE 20 MG PO TABS: 20.0000 mg | ORAL_TABLET | Freq: Every day | ORAL | 0 refills | Status: DC

## 2023-12-17 DIAGNOSIS — L821 Other seborrheic keratosis: Secondary | ICD-10-CM | POA: Diagnosis not present

## 2023-12-17 DIAGNOSIS — L718 Other rosacea: Secondary | ICD-10-CM | POA: Diagnosis not present

## 2023-12-17 DIAGNOSIS — C44319 Basal cell carcinoma of skin of other parts of face: Secondary | ICD-10-CM | POA: Diagnosis not present

## 2023-12-17 DIAGNOSIS — D225 Melanocytic nevi of trunk: Secondary | ICD-10-CM | POA: Diagnosis not present

## 2023-12-18 ENCOUNTER — Ambulatory Visit (INDEPENDENT_AMBULATORY_CARE_PROVIDER_SITE_OTHER): Payer: PPO

## 2023-12-18 ENCOUNTER — Telehealth: Payer: Self-pay | Admitting: Cardiology

## 2023-12-18 DIAGNOSIS — I495 Sick sinus syndrome: Secondary | ICD-10-CM

## 2023-12-18 LAB — CUP PACEART REMOTE DEVICE CHECK
Battery Remaining Longevity: 132 mo
Battery Remaining Percentage: 100 %
Brady Statistic RA Percent Paced: 65 %
Brady Statistic RV Percent Paced: 5 %
Date Time Interrogation Session: 20250801035400
Lead Channel Impedance Value: 539 Ohm
Lead Channel Impedance Value: 713 Ohm
Lead Channel Pacing Threshold Amplitude: 0.6 V
Lead Channel Pacing Threshold Amplitude: 0.7 V
Lead Channel Pacing Threshold Pulse Width: 0.4 ms
Lead Channel Pacing Threshold Pulse Width: 0.4 ms
Lead Channel Setting Pacing Amplitude: 1.2 V
Lead Channel Setting Pacing Amplitude: 2 V
Lead Channel Setting Pacing Pulse Width: 0.4 ms
Lead Channel Setting Sensing Sensitivity: 2.5 mV
Pulse Gen Serial Number: 942377
Zone Setting Status: 755011

## 2023-12-18 NOTE — Telephone Encounter (Signed)
 Spoke with pt regarding shortness of breath and chest pain. Pt stated that her breathing pattern has changed. She feels she cannot take a deep breath. This has been going on for about 3 weeks. Pt did not sound short of breath on the phone. Pt also stated she had 2 incidences of chest pain last week that lasted roughly 30 seconds. Pt stated she thought it might be something she ate but wanted to let us  know just incase. Pt did not have any blood pressures or heart rates to share. Pt was told that if she has chest pain that does not go away within seconds or shortness of breath at rest that she should go to the emergency room. Pt was told that the information she provided would be sent to Dr. Jeffrie for his suggestions. Pt verbalized understanding. All questions if any were answered.

## 2023-12-18 NOTE — Telephone Encounter (Signed)
 Pt c/o Shortness Of Breath: STAT if SOB developed within the last 24 hours or pt is noticeably SOB on the phone  1. Are you currently SOB (can you hear that pt is SOB on the phone)? no  2. How long have you been experiencing SOB? 3 weeks  3. Are you SOB when sitting or when up moving around? both  4. Are you currently experiencing any other symptoms? 2 times last week had chest pain

## 2023-12-19 ENCOUNTER — Ambulatory Visit: Payer: Self-pay | Admitting: Internal Medicine

## 2023-12-21 NOTE — Telephone Encounter (Signed)
Patient called to check on status of her call.  

## 2023-12-21 NOTE — Telephone Encounter (Signed)
 Advised patient that message has been sent to Dr. Jeffrie and his nurse will give her a call once he provides recommendations.

## 2023-12-28 ENCOUNTER — Encounter: Payer: Self-pay | Admitting: Physician Assistant

## 2023-12-28 ENCOUNTER — Ambulatory Visit: Attending: Physician Assistant | Admitting: Physician Assistant

## 2023-12-28 VITALS — BP 144/80 | HR 60 | Ht 64.0 in | Wt 186.8 lb

## 2023-12-28 DIAGNOSIS — I480 Paroxysmal atrial fibrillation: Secondary | ICD-10-CM

## 2023-12-28 DIAGNOSIS — R030 Elevated blood-pressure reading, without diagnosis of hypertension: Secondary | ICD-10-CM

## 2023-12-28 DIAGNOSIS — Z950 Presence of cardiac pacemaker: Secondary | ICD-10-CM

## 2023-12-28 DIAGNOSIS — R0602 Shortness of breath: Secondary | ICD-10-CM

## 2023-12-28 DIAGNOSIS — R0600 Dyspnea, unspecified: Secondary | ICD-10-CM | POA: Diagnosis not present

## 2023-12-28 DIAGNOSIS — I495 Sick sinus syndrome: Secondary | ICD-10-CM | POA: Diagnosis not present

## 2023-12-28 DIAGNOSIS — R079 Chest pain, unspecified: Secondary | ICD-10-CM | POA: Diagnosis not present

## 2023-12-28 MED ORDER — FUROSEMIDE 20 MG PO TABS
ORAL_TABLET | ORAL | 3 refills | Status: DC
Start: 2023-12-28 — End: 2024-02-11

## 2023-12-28 NOTE — Telephone Encounter (Signed)
 Patient scheduled with APP for 2:45 PM today.

## 2023-12-28 NOTE — Telephone Encounter (Signed)
 Patient's daughter called the answering service today to follow-up on previous phone calls.  Patient has continued to be short of breath and seems to get short of breath with little movement.  She has not had recurrence of chest pain since she called on 8/1.  Patient's daughter would like her to be seen in clinic for an appointment next available.  I agree this is reasonable.  Will message scheduling/triage to arrange appointment within the next couple of days if possible

## 2023-12-28 NOTE — Progress Notes (Signed)
 Cardiology Office Note   Date:  12/28/2023  ID:  [name], DOB 1935-10-26, MRN [id number] PCP: Cicero Aureliano SAUNDERS, MD  [facility] Providers Cardiologist:  Oneil Parchment, MD Electrophysiologist:  Danelle Birmingham, MD   History of Present Illness [name] is a 88 y.o. female with a past medical history [place] for pacemaker, sick sinus syndrome, [employer] pacemaker implanted, PAF, hypertension, NSVT, and fatigue here for follow-up appointment.  Was seen by Dr. Parchment 11/24/2022 and had her pacemaker adjusted.  Low burden of atrial fibrillation.  No bleeding or syncope.  Brief dizziness at times.  Has minor palpitations here and there.  Family history includes mother dying from an MI, brother died with MI, sister atrial fibrillation.  She was added on my schedule today for worsening shortness of breath and 2 episodes of chest pain.  She is here for further workup.  Today, she presents with atrial fibrillation and a pacemaker with chest pain and shortness of breath.   Approximately three to three and a half weeks ago, she experienced severe chest pain on the left side of her chest, followed by a second episode shortly after. Since then, she has had continuous shortness of breath, which has not improved. Her breathing pattern has changed to 'shorter breaths' with difficulty taking deep breaths, particularly noticeable during exertion.  She has atrial fibrillation and a pacemaker placed four years ago. Previously well-controlled, she now experiences increased frequency of palpitations over the past two to three weeks, though they remain brief and not daily.  She notes swelling in her legs with a sensation of tight skin, despite stable weight. Her current medication includes Lasix , taken at 8 AM.  No orthopnea, PND.   Discussed the use of AI scribe software for clinical note transcription with the patient, who gave verbal consent to proceed.   ROS: pertinent ROS  in HPI  Studies Reviewed EKG Interpretation Date/Time:  Monday December 28 2023 14:43:40 EDT Ventricular Rate:  60 PR Interval:  244 QRS Duration:  82 QT Interval:  432 QTC Calculation: 432 R Axis:   -12  Text Interpretation: Atrial-paced rhythm with prolonged AV conduction Minimal voltage criteria for LVH, may be normal variant ( R in aVL ) Possible Anteroseptal infarct , age undetermined When compared with ECG of 07-Jan-2013 13:14, Electronic atrial pacemaker has replaced Sinus rhythm Borderline criteria for Anteroseptal infarct are now Present Confirmed by Lucien Blanc [phone]) on 12/28/2023 3:05:56 PM   Echo 07/03/22 IMPRESSIONS     1. Left ventricular ejection fraction by 3D volume is 55 %. The left  ventricle has normal function. The left ventricle has no regional wall  motion abnormalities. There is mild concentric left ventricular  hypertrophy. Left ventricular diastolic function  could not be evaluated.   2. Right ventricular systolic function is normal. The right ventricular  size is normal. There is normal pulmonary artery systolic pressure.   3. The mitral valve is degenerative. Mild mitral valve regurgitation. No  evidence of mitral stenosis.   4. The aortic valve is normal in structure. Aortic valve regurgitation is  not visualized. No aortic stenosis is present.   5. The inferior vena cava is normal in size with greater than 50%  respiratory variability, suggesting right atrial pressure of 3 mmHg.    Risk Assessment/Calculations  CHA2DS2-VASc Score = 3   This indicates a 3.2% annual risk of stroke. The patient's score is based upon: CHF History: 0 HTN History: 0 Diabetes History: 0 Stroke History:  0 Vascular Disease History: 0 Age Score: 2 Gender Score: 1       Physical Exam VS:  BP (!) 144/80 (BP Location: Left Arm, Patient Position: Sitting, Cuff Size: Large)   Pulse 60   Ht 5' 4 (1.626 m)   Wt 186 lb 12.8 oz (84.7 kg)   SpO2 96%   BMI 32.06 kg/m         Wt Readings from Last 3 Encounters:  12/28/23 186 lb 12.8 oz (84.7 kg)  12/09/22 183 lb 9.6 oz (83.3 kg)  11/24/22 184 lb 9.6 oz (83.7 kg)    GEN: Well nourished, well developed in no acute distress NECK: No JVD; No carotid bruits CARDIAC: RRR, no murmurs, rubs, gallops RESPIRATORY:  Clear to auscultation without rales, wheezing or rhonchi  ABDOMEN: Soft, non-tender, non-distended EXTREMITIES:  No edema; No deformity   ASSESSMENT AND PLAN  Shortness of breath and chest pain Severe chest pain and persistent shortness of breath suggest possible cardiac rhythm issues or coronary artery disease. Previous echocardiogram and stress test outdated. - Order echocardiogram  - Order chemical stress test to evaluate for coronary artery disease.  Paroxysmal atrial fibrillation and palpitations, status post pacemaker placement Intermittent palpitations have increased but are not severe. Pacemaker functioning well, no atrial fibrillation on EKG. - Follow up with Dr. Waddell to review pacemaker function and address palpitations.  Peripheral edema Leg swelling indicates fluid retention. Weight stable, but skin tightness noted. Currently on Lasix  20 mg daily. - Advise extra 20 mg Lasix  at 1 PM for three days. - Monitor for reduction in swelling and weight.  Hypertension Blood pressure elevated at 144/80, above usual range. - Monitor blood pressure.     Informed Consent   Shared Decision Making/Informed Consent The risks [chest pain, shortness of breath, cardiac arrhythmias, dizziness, blood pressure fluctuations, myocardial infarction, stroke/transient ischemic attack, nausea, vomiting, allergic reaction, radiation exposure, metallic taste sensation and life-threatening complications (estimated to be 1 in 10,000)], benefits (risk stratification, diagnosing coronary artery disease, treatment guidance) and alternatives of a nuclear stress test were discussed in detail with Ms. Hegeman and she agrees  to proceed.     Dispo: Follow-up in 4 weeks  Signed, Orren LOISE Fabry, PA-C

## 2023-12-28 NOTE — Patient Instructions (Addendum)
 Medication Instructions:  Your physician has recommended you make the following change in your medication:  INCREASE LASIX  TO 40 MG FOR 3 DAYS AND THEN DECREASE TO AS NEEDED   *If you need a refill on your cardiac medications before your next appointment, please call your pharmacy*  Lab Work: NONE If you have labs (blood work) drawn today and your tests are completely normal, you will receive your results only by: MyChart Message (if you have MyChart) OR A paper copy in the mail If you have any lab test that is abnormal or we need to change your treatment, we will call you to review the results.  Testing/Procedures: Your physician has requested that you have an echocardiogram. Echocardiography is a painless test that uses sound waves to create images of your heart. It provides your doctor with information about the size and shape of your heart and how well your heart's chambers and valves are working. This procedure takes approximately one hour. There are no restrictions for this procedure. Please do NOT wear cologne, perfume, aftershave, or lotions (deodorant is allowed). Please arrive 15 minutes prior to your appointment time.  Please note: We ask at that you not bring children with you during ultrasound (echo/ vascular) testing. Due to room size and safety concerns, children are not allowed in the ultrasound rooms during exams. Our front office staff cannot provide observation of children in our lobby area while testing is being conducted. An adult accompanying a patient to their appointment will only be allowed in the ultrasound room at the discretion of the ultrasound technician under special circumstances. We apologize for any inconvenience.   Your physician has requested that you have a lexiscan  myoview . For further information please visit [email or web address] Please follow instruction sheet, as given.   Follow-Up: At [facility], you and your health needs are our priority.   As part of our continuing mission to provide you with exceptional heart care, our providers are all part of one team.  This team includes your primary Cardiologist (physician) and Advanced Practice Providers or APPs (Physician Assistants and Nurse Practitioners) who all work together to provide you with the care you need, when you need it.  Your next appointment:   1 month(s)  Provider:   Danelle Birmingham, MD   THEN, Your physician recommends that you schedule a follow-up appointment in: 6 WEEKS WITH TESSA CONTE, PA-C OR ANY OTHER APP   We recommend signing up for the patient portal called MyChart.  Sign up information is provided on this After Visit Summary.  MyChart is used to connect with patients for Virtual Visits (Telemedicine).  Patients are able to view lab/test results, encounter notes, upcoming appointments, etc.  Non-urgent messages can be sent to your provider as well.   To learn more about what you can do with MyChart, go to [email or web address].   Other Instructions Please make sure to weigh daily and if your weight increases 3 lbs or more in 1 day or 5 lbs in 1 week call our office to let us  know [phone].   Two Gram Sodium Diet 2000 mg  What is Sodium? Sodium is a mineral found naturally in many foods. The most significant source of sodium in the diet is table salt, which is about 40% sodium.  Processed, convenience, and preserved foods also contain a large amount of sodium.  The body needs only 500 mg of sodium daily to function,  A normal diet provides more than enough sodium even if you  do not use salt.  Why Limit Sodium? A build up of sodium in the body can cause thirst, increased blood pressure, shortness of breath, and water retention.  Decreasing sodium in the diet can reduce edema and risk of heart attack or stroke associated with high blood pressure.  Keep in mind that there are many other factors involved in these health problems.  Heredity, obesity, lack of  exercise, cigarette smoking, stress and what you eat all play a role.  General Guidelines: Do not add salt at the table or in cooking.  One teaspoon of salt contains over 2 grams of sodium. Read food labels Avoid processed and convenience foods Ask your dietitian before eating any foods not dicussed in the menu planning guidelines Consult your physician if you wish to use a salt substitute or a sodium containing medication such as antacids.  Limit milk and milk products to 16 oz (2 cups) per day.  Shopping Hints: READ LABELS!! Dietetic does not necessarily mean low sodium. Salt and other sodium ingredients are often added to foods during processing.    Menu Planning Guidelines Food Group Choose More Often Avoid  Beverages (see also the milk group All fruit juices, low-sodium, salt-free vegetables juices, low-sodium carbonated beverages Regular vegetable or tomato juices, commercially softened water used for drinking or cooking  Breads and Cereals Enriched white, wheat, rye and pumpernickel bread, hard rolls and dinner rolls; muffins, cornbread and waffles; most dry cereals, cooked cereal without added salt; unsalted crackers and breadsticks; low sodium or homemade bread crumbs Bread, rolls and crackers with salted tops; quick breads; instant hot cereals; pancakes; commercial bread stuffing; self-rising flower and biscuit mixes; regular bread crumbs or cracker crumbs  Desserts and Sweets Desserts and sweets mad with mild should be within allowance Instant pudding mixes and cake mixes  Fats Butter or margarine; vegetable oils; unsalted salad dressings, regular salad dressings limited to 1 Tbs; light, sour and heavy cream Regular salad dressings containing bacon fat, bacon bits, and salt pork; snack dips made with instant soup mixes or processed cheese; salted nuts  Fruits Most fresh, frozen and canned fruits Fruits processed with salt or sodium-containing ingredient (some dried fruits are  processed with sodium sulfites        Vegetables Fresh, frozen vegetables and low- sodium canned vegetables Regular canned vegetables, sauerkraut, pickled vegetables, and others prepared in brine; frozen vegetables in sauces; vegetables seasoned with ham, bacon or salt pork  Condiments, Sauces, Miscellaneous  Salt substitute with physician's approval; pepper, herbs, spices; vinegar, lemon or lime juice; hot pepper sauce; garlic powder, onion powder, low sodium soy sauce (1 Tbs.); low sodium condiments (ketchup, chili sauce, mustard) in limited amounts (1 tsp.) fresh ground horseradish; unsalted tortilla chips, pretzels, potato chips, popcorn, salsa (1/4 cup) Any seasoning made with salt including garlic salt, celery salt, onion salt, and seasoned salt; sea salt, rock salt, kosher salt; meat tenderizers; monosodium glutamate; mustard, regular soy sauce, barbecue, sauce, chili sauce, teriyaki sauce, steak sauce, Worcestershire sauce, and most flavored vinegars; canned gravy and mixes; regular condiments; salted snack foods, olives, picles, relish, horseradish sauce, catsup   Food preparation: Try these seasonings Meats:    Pork Sage, onion Serve with applesauce  Chicken Poultry seasoning, thyme, parsley Serve with cranberry sauce  Lamb Curry powder, rosemary, garlic, thyme Serve with mint sauce or jelly  Veal Marjoram, basil Serve with current jelly, cranberry sauce  Beef Pepper, bay leaf Serve with dry mustard, unsalted chive butter  Fish [place] leaf, dill  Serve with unsalted lemon butter, unsalted parsley butter  Vegetables:    Asparagus Lemon juice   Broccoli Lemon juice   Carrots Mustard dressing parsley, mint, nutmeg, glazed with unsalted butter and sugar   Green beans Marjoram, lemon juice, nutmeg,dill seed   Tomatoes Basil, marjoram, onion   Spice /blend for [employer]  4 tsp ground thyme 1 tsp ground sage 3 tsp ground rosemary 4 tsp ground marjoram   Test your knowledge A product  that says Salt Free may still contain sodium. True or False Garlic Powder and Hot Pepper Sauce an be used as alternative seasonings.True or False Processed foods have more sodium than fresh foods.  True or False Canned Vegetables have less sodium than froze True or False   WAYS TO DECREASE YOUR SODIUM INTAKE Avoid the use of added salt in cooking and at the table.  Table salt (and other prepared seasonings which contain salt) is probably one of the greatest sources of sodium in the diet.  Unsalted foods can gain flavor from the sweet, sour, and butter taste sensations of herbs and spices.  Instead of using salt for seasoning, try the following seasonings with the foods listed.  Remember: how you use them to enhance natural food flavors is limited only by your creativity... Allspice-Meat, fish, eggs, fruit, peas, red and yellow vegetables Almond Extract-Fruit baked goods Anise Seed-Sweet breads, fruit, carrots, beets, cottage cheese, cookies (tastes like licorice) Basil-Meat, fish, eggs, vegetables, rice, vegetables salads, soups, sauces Bay Leaf-Meat, fish, stews, poultry Burnet-Salad, vegetables (cucumber-like flavor) Caraway Seed-Bread, cookies, cottage cheese, meat, vegetables, cheese, rice Cardamon-Baked goods, fruit, soups Celery Powder or seed-Salads, salad dressings, sauces, meatloaf, soup, bread.Do not use  celery salt Chervil-Meats, salads, fish, eggs, vegetables, cottage cheese (parsley-like flavor) Chili Power-Meatloaf, chicken cheese, corn, eggplant, egg dishes Chives-Salads cottage cheese, egg dishes, soups, vegetables, sauces Cilantro-Salsa, casseroles Cinnamon-Baked goods, fruit, pork, lamb, chicken, carrots Cloves-Fruit, baked goods, fish, pot roast, green beans, beets, carrots Coriander-Pastry, cookies, meat, salads, cheese (lemon-orange flavor) Cumin-Meatloaf, fish,cheese, eggs, cabbage,fruit pie (caraway flavor) [employer], fruit, eggs, fish, poultry, cottage  cheese, vegetables Dill Seed-Meat, cottage cheese, poultry, vegetables, fish, salads, bread Fennel Seed-Bread, cookies, apples, pork, eggs, fish, beets, cabbage, cheese, Licorice-like flavor Garlic-(buds or powder) Salads, meat, poultry, fish, bread, butter, vegetables, potatoes.Do not  use garlic salt Ginger-Fruit, vegetables, baked goods, meat, fish, poultry Horseradish Root-Meet, vegetables, butter Lemon Juice or Extract-Vegetables, fruit, tea, baked goods, fish salads Mace-Baked goods fruit, vegetables, fish, poultry (taste like nutmeg) Maple Extract-Syrups Marjoram-Meat, chicken, fish, vegetables, breads, green salads (taste like Sage) Mint-Tea, lamb, sherbet, vegetables, desserts, carrots, cabbage Mustard, Dry or Seed-Cheese, eggs, meats, vegetables, poultry Nutmeg-Baked goods, fruit, chicken, eggs, vegetables, desserts Onion Powder-Meat, fish, poultry, vegetables, cheese, eggs, bread, rice salads (Do not use   Onion salt) Orange Extract-Desserts, baked goods Oregano-Pasta, eggs, cheese, onions, pork, lamb, fish, chicken, vegetables, green salads Paprika-Meat, fish, poultry, eggs, cheese, vegetables Parsley Flakes-Butter, vegetables, meat fish, poultry, eggs, bread, salads (certain forms may   Contain sodium Pepper-Meat fish, poultry, vegetables, eggs Peppermint Extract-Desserts, baked goods Poppy Seed-Eggs, bread, cheese, fruit dressings, baked goods, noodles, vegetables, cottage  [employer], poultry, meat, fish, cauliflower, turnips,eggs bread Saffron-Rice, bread, veal, chicken, fish, eggs Sage-Meat, fish, poultry, onions, eggplant, tomateos, pork, stews Savory-Eggs, salads, poultry, meat, rice, vegetables, soups, pork Tarragon-Meat, poultry, fish, eggs, butter, vegetables (licorice-like flavor)  Thyme-Meat, poultry, fish, eggs, vegetables, (clover-like flavor), sauces, soups Tumeric-Salads, butter, eggs, fish, rice, vegetables  (saffron-like flavor) Vanilla Extract-Baked goods, candy Vinegar-Salads, vegetables,  meat marinades Walnut Extract-baked goods, candy   2. Choose your Foods Wisely   The following is a list of foods to avoid which are high in sodium:  Meats-Avoid all smoked, canned, salt cured, dried and kosher meat and fish as well as Anchovies   [employer] meats:Bologna, Liverwurst, Pastrami Canned meat or fish  Marinated herring Caviar    Pepperoni Corned Beef   Pizza Dried chipped beef  Salami Frozen breaded fish or meat Salt pork Frankfurters or hot dogs  Sardines Gefilte fish   Sausage Ham (boiled ham, Proscuitto Smoked butt    spiced ham)   Spam      TV Dinners Vegetables Canned vegetables (Regular) Relish Canned mushrooms  Sauerkraut Olives    Tomato juice Pickles  Bakery and Dessert Products Canned puddings  Cream pies Cheesecake   Decorated cakes Cookies  Beverages/Juices Tomato juice, regular  Gatorade   V-8 vegetable juice, regular  Breads and Cereals Biscuit mixes   Salted potato chips, corn chips, pretzels Bread stuffing mixes  Salted crackers and rolls Pancake and waffle mixes Self-rising flour  Seasonings Accent    Meat sauces Barbecue sauce  Meat tenderizer Catsup    Monosodium glutamate (MSG) Celery salt   Onion salt Chili sauce   Prepared mustard Garlic salt   Salt, seasoned salt, sea salt Gravy mixes   Soy sauce Horseradish   Steak sauce Ketchup   Tartar sauce Lite salt    Teriyaki sauce Marinade mixes   Worcestershire sauce  Others Baking powder   Cocoa and cocoa mixes Baking soda   Commercial casserole mixes Candy-caramels, chocolate  Dehydrated soups    Bars, fudge,nougats  Instant rice and pasta mixes Canned broth or soup  Maraschino cherries Cheese, aged and processed cheese and cheese spreads  Learning Assessment Quiz  Indicated T (for True) or F (for False) for each of the following statements:  _____ Fresh fruits and vegetables  and unprocessed grains are generally low in sodium _____ Water may contain a considerable amount of sodium, depending on the source _____ You can always tell if a food is high in sodium by tasting it _____ Certain laxatives my be high in sodium and should be avoided unless prescribed   by a physician or pharmacist _____ Salt substitutes may be used freely by anyone on a sodium restricted diet _____ Sodium is present in table salt, food additives and as a natural component of   most foods _____ Table salt is approximately 90% sodium _____ Limiting sodium intake may help prevent excess fluid accumulation in the body _____ On a sodium-restricted diet, seasonings such as bouillon soy sauce, and    cooking wine should be used in place of table salt _____ On an ingredient list, a product which lists monosodium glutamate as the first   ingredient is an appropriate food to include on a low sodium diet  Circle the best answer(s) to the following statements (Hint: there may be more than one correct answer)  11. On a low-sodium diet, some acceptable snack items are:    A. Olives  F. Bean dip   K. Grapefruit juice    B. Salted Pretzels G. Commercial Popcorn   L. Canned peaches    C. Carrot Sticks  H. Bouillon   M. Unsalted nuts   D. [place] fries  I. Peanut butter crackers N. Salami   E. Sweet pickles J. Tomato Juice   O. Pizza  12.  Seasonings that may be  used freely on a reduced - sodium diet include   A. Lemon wedges F.Monosodium glutamate K. Celery seed    B.Soysauce   G. Pepper   L. Mustard powder   C. Sea salt  H. Cooking wine  M. Onion flakes   D. Vinegar  E. Prepared horseradish N. Salsa   E. Sage   J. Worcestershire sauce  O. Chutney

## 2023-12-30 ENCOUNTER — Ambulatory Visit (HOSPITAL_COMMUNITY)
Admission: RE | Admit: 2023-12-30 | Discharge: 2023-12-30 | Disposition: A | Discharge status: Home / Self Care | Source: Ambulatory Visit | Attending: Internal Medicine | Admitting: Internal Medicine

## 2023-12-30 ENCOUNTER — Encounter (HOSPITAL_COMMUNITY): Payer: Self-pay | Admitting: *Deleted

## 2023-12-30 ENCOUNTER — Telehealth (HOSPITAL_COMMUNITY): Payer: Self-pay | Admitting: *Deleted

## 2023-12-30 DIAGNOSIS — Z780 Asymptomatic menopausal state: Secondary | ICD-10-CM | POA: Insufficient documentation

## 2023-12-30 DIAGNOSIS — Z13820 Encounter for screening for osteoporosis: Secondary | ICD-10-CM | POA: Diagnosis not present

## 2023-12-30 DIAGNOSIS — M8580 Other specified disorders of bone density and structure, unspecified site: Secondary | ICD-10-CM

## 2023-12-30 DIAGNOSIS — M8589 Other specified disorders of bone density and structure, multiple sites: Secondary | ICD-10-CM | POA: Insufficient documentation

## 2023-12-30 NOTE — Telephone Encounter (Signed)
 STRESS TEST instructions were sent via mail [employer]PS.

## 2024-01-07 ENCOUNTER — Other Ambulatory Visit: Payer: Self-pay | Admitting: Physician Assistant

## 2024-01-07 DIAGNOSIS — R079 Chest pain, unspecified: Secondary | ICD-10-CM

## 2024-01-07 DIAGNOSIS — R0602 Shortness of breath: Secondary | ICD-10-CM

## 2024-01-08 ENCOUNTER — Ambulatory Visit (HOSPITAL_COMMUNITY)
Admission: RE | Admit: 2024-01-08 | Discharge: 2024-01-08 | Disposition: A | Discharge status: Home / Self Care | Source: Ambulatory Visit | Attending: Physician Assistant | Admitting: Physician Assistant

## 2024-01-08 ENCOUNTER — Ambulatory Visit: Payer: Self-pay | Admitting: Physician Assistant

## 2024-01-08 DIAGNOSIS — R079 Chest pain, unspecified: Secondary | ICD-10-CM | POA: Insufficient documentation

## 2024-01-08 DIAGNOSIS — R0602 Shortness of breath: Secondary | ICD-10-CM | POA: Insufficient documentation

## 2024-01-08 LAB — MYOCARDIAL PERFUSION IMAGING
LV dias vol: 50 mL (ref 46–106)
LV sys vol: 11 mL (ref 3.8–5.2)
Nuc Stress EF: 78 %
Peak HR: 75 {beats}/min
Rest HR: 60 {beats}/min
Rest Nuclear Isotope Dose: 9.8 mCi
SDS: 0
SRS: 9
SSS: 2
ST Depression (mm): 0 mm
Stress Nuclear Isotope Dose: 30.7 mCi
TID: 1

## 2024-01-08 MED ORDER — TECHNETIUM TC 99M TETROFOSMIN IV KIT
30.7000 | PACK | Freq: Once | INTRAVENOUS | Status: AC | PRN
  Administered 2024-01-08: 30.7 via INTRAVENOUS

## 2024-01-08 MED ORDER — REGADENOSON 0.4 MG/5ML IV SOLN
INTRAVENOUS | Status: AC
  Filled 2024-01-08: qty 5

## 2024-01-08 MED ORDER — REGADENOSON 0.4 MG/5ML IV SOLN
0.4000 mg | Freq: Once | INTRAVENOUS | Status: AC
  Administered 2024-01-08: 0.4 mg via INTRAVENOUS

## 2024-01-08 MED ORDER — TECHNETIUM TC 99M TETROFOSMIN IV KIT
9.8000 | PACK | Freq: Once | INTRAVENOUS | Status: AC | PRN
  Administered 2024-01-08: 9.8 via INTRAVENOUS

## 2024-01-12 ENCOUNTER — Ambulatory Visit: Payer: Self-pay | Admitting: Physician Assistant

## 2024-01-12 DIAGNOSIS — I2510 Atherosclerotic heart disease of native coronary artery without angina pectoris: Secondary | ICD-10-CM

## 2024-01-19 ENCOUNTER — Other Ambulatory Visit: Payer: Self-pay

## 2024-01-19 DIAGNOSIS — I2510 Atherosclerotic heart disease of native coronary artery without angina pectoris: Secondary | ICD-10-CM

## 2024-01-20 LAB — LIPID PANEL
Chol/HDL Ratio: 4.6 ratio — ABNORMAL HIGH (ref 0.0–4.4)
Cholesterol, Total: 189 mg/dL (ref 100–199)
HDL: 41 mg/dL (ref >39)
LDL Chol Calc (NIH): 113 mg/dL — ABNORMAL HIGH (ref 0–99)
Triglycerides: 200 mg/dL — ABNORMAL HIGH (ref 0–149)
VLDL Cholesterol Cal: 35 mg/dL (ref 5–40)

## 2024-01-22 ENCOUNTER — Ambulatory Visit: Payer: Self-pay | Admitting: Physician Assistant

## 2024-01-28 DIAGNOSIS — Z08 Encounter for follow-up examination after completed treatment for malignant neoplasm: Secondary | ICD-10-CM | POA: Diagnosis not present

## 2024-01-28 DIAGNOSIS — L258 Unspecified contact dermatitis due to other agents: Secondary | ICD-10-CM | POA: Diagnosis not present

## 2024-01-28 DIAGNOSIS — Z85828 Personal history of other malignant neoplasm of skin: Secondary | ICD-10-CM | POA: Diagnosis not present

## 2024-01-29 ENCOUNTER — Ambulatory Visit: Attending: Internal Medicine | Admitting: Internal Medicine

## 2024-01-29 ENCOUNTER — Encounter: Payer: Self-pay | Admitting: Internal Medicine

## 2024-01-29 VITALS — BP 149/80 | HR 59 | Ht 64.0 in | Wt 189.1 lb

## 2024-01-29 DIAGNOSIS — I480 Paroxysmal atrial fibrillation: Secondary | ICD-10-CM

## 2024-01-29 LAB — CUP PACEART INCLINIC DEVICE CHECK
Date Time Interrogation Session: 20250912101204
Implantable Lead Connection Status: 753985
Implantable Lead Connection Status: 753985
Implantable Lead Implant Date: 20210805
Implantable Lead Implant Date: 20210805
Implantable Lead Location: 753859
Implantable Lead Location: 753860
Implantable Lead Model: 7841
Implantable Lead Model: 7842
Implantable Lead Serial Number: 1049950
Implantable Lead Serial Number: 1083642
Implantable Pulse Generator Implant Date: 20210805
Lead Channel Impedance Value: 562 Ohm
Lead Channel Impedance Value: 721 Ohm
Lead Channel Pacing Threshold Amplitude: 0.6 V
Lead Channel Pacing Threshold Amplitude: 0.7 V
Lead Channel Pacing Threshold Pulse Width: 0.4 ms
Lead Channel Pacing Threshold Pulse Width: 0.4 ms
Lead Channel Sensing Intrinsic Amplitude: 13.1 mV
Lead Channel Sensing Intrinsic Amplitude: 4.2 mV
Lead Channel Setting Pacing Amplitude: 1.1 V
Lead Channel Setting Pacing Amplitude: 2 V
Lead Channel Setting Pacing Pulse Width: 0.4 ms
Lead Channel Setting Sensing Sensitivity: 2.5 mV
Pulse Gen Serial Number: 942377
Zone Setting Status: 755011

## 2024-01-29 NOTE — Progress Notes (Signed)
 HPI Mrs. [name] returns today for followup of sinus node dysfunction , s/p PPM insertion. In the interim, she has had minimal problems with palpitations and has been found to have atrial fib with a RVR. She has not had syncope. She denies chest pain bit does have some sob. She has been on eliquis .   Allergies  Allergen Reactions   Amoxicillin Diarrhea    Per patient severe diarrhea required hospilization   Iodine Rash     Current Outpatient Medications  Medication Sig Dispense Refill   acetaminophen  (TYLENOL ) 500 MG tablet Take 500-1,000 mg by mouth every 6 (six) hours as needed (for pain.).     apixaban  (ELIQUIS ) 5 MG TABS tablet Take 1 tablet by mouth twice daily 180 tablet 1   cholecalciferol (VITAMIN D3) 25 MCG (1000 UNIT) tablet Take by mouth.     furosemide  (LASIX ) 20 MG tablet TAKE 2 TABLETS FOR 3 DAYS AND THEN DECREASE TO AS NEEDED FOR FLUID OR SWELLING 90 tablet 3   KLOR-CON M20 20 MEQ tablet Take 20 mEq by mouth daily.      metoprolol  succinate (TOPROL -XL) 50 MG 24 hr tablet Take 1 tablet by mouth twice daily 180 tablet 2   metroNIDAZOLE (METROCREAM) 0.75 % cream Apply 1 application topically every other day.     No current facility-administered medications for this visit.     Past Medical History:  Diagnosis Date   Ankle tendinitis    Breast cancer (HCC)    Breast cancer, left breast (HCC) 08/09/2012   Gall stones    Hip bursitis    Hypertension    Menorrhagia    Migraine    Osteopenia 03/27/2014   Osteoporosis    Personal history of radiation therapy 2012   Left Breast Cancer   Plantar fasciitis    RLS (restless legs syndrome)    Shortness of breath    once a year- gets checked by Dr.   Colbert incontinence    Trigger finger    CTS    ROS:   All systems reviewed and negative except as noted in the HPI.   Past Surgical History:  Procedure Laterality Date   ABDOMINAL HYSTERECTOMY     Ovaries retained   BLADDER SURGERY     Bladder Tack   BREAST  LUMPECTOMY Left 2012   BREAST MASS EXCISION     Benign lump removal   CARPAL TUNNEL RELEASE Right 01/11/2013   Procedure: CARPAL TUNNEL RELEASE, RELEASE A-1 PULLEY RIGHT INDEX FINGER;  Surgeon: Lamar LULLA Leonor Mickey., MD;  Location: [facility];  Service: Orthopedics;  Laterality: Right;   CHOLECYSTECTOMY     PACEMAKER IMPLANT N/A 12/22/2019   Procedure: PACEMAKER IMPLANT;  Surgeon: Waddell Danelle ORN, MD;  Location: [facility];  Service: Cardiovascular;  Laterality: N/A;   SHOULDER SURGERY Bilateral      Family History  Problem Relation Age of Onset   Heart Problems Mother    Hypertension Mother    Diabetes Father    Cancer Sister        Lung and Ovarian   Diabetes Sister    Heart Problems Brother    Diabetes Brother    Diabetes Sister    Diabetes Sister    Breast cancer Maternal Grandmother      Social History   Socioeconomic History   Marital status: Widowed    Spouse name: Not on file   Number of children: Not on file   Years  of education: Not on file   Highest education level: Not on file  Occupational History   Not on file  Tobacco Use   Smoking status: Never   Smokeless tobacco: Never  Substance and Sexual Activity   Alcohol use: No   Drug use: No   Sexual activity: Never    Birth control/protection: Post-menopausal  Other Topics Concern   Not on file  Social History Narrative   Not on file   Social Drivers of Health   Financial Resource Strain: Not on file  Food Insecurity: Not on file  Transportation Needs: Not on file  Physical Activity: Not on file  Stress: Not on file  Social Connections: Not on file  Intimate Partner Violence: Not on file     BP (!) 149/80   Pulse (!) 59   Ht 5' 4 (1.626 m)   Wt 189 lb 1.6 oz (85.8 kg)   SpO2 95%   BMI 32.46 kg/m   Physical Exam:  Well appearing NAD HEENT: Unremarkable Neck:  No JVD, no thyromegally Lymphatics:  No adenopathy Back:  No CVA tenderness Lungs:  Clear with no  wheezes HEART:  Regular rate rhythm, no murmurs, no rubs, no clicks Abd:  soft, positive bowel sounds, no organomegally, no rebound, no guarding Ext:  2 plus pulses, no edema, no cyanosis, no clubbing Skin:  No rashes no nodules Neuro:  CN II through XII intact, motor grossly intact  DEVICE  Normal device function.  See PaceArt for details.   Assess/Plan:  Sinus node dysfunction - she is asymptomatic, s/p PPM insertion. She is pacing over 50% in the atrium. 2. PPM - her [employer]. Jude DDD PM is working normally. 3. PAF - she has tolerated her eliquis . She has rare palps.  4. HTN - her bp is well controlled. 5. Dyspnea - she has admitted to dietary indiscretion. I asked her to avoid salt. We considered increasingher lasix  but will hold off for now.   Danelle Rejina Odle,MD

## 2024-01-29 NOTE — Patient Instructions (Signed)
 Medication Instructions:  Your physician recommends that you continue on your current medications as directed. Please refer to the Current Medication list given to you today.  *If you need a refill on your cardiac medications before your next appointment, please call your pharmacy*  Lab Work: None ordered.  You may go to any [facility] Location for your lab work:  [facility] - [address] ([facility]) - [address] [phone] [address]  [place] - [address]   [place]  - [address]   [place] - [address] - [address] [employer]  - [address] [place] - [address] ([facility]   If you have labs (blood work) drawn today and your tests are completely normal, you will receive your results only by: [employer] (if you have MyChart)  If you have any lab test that is abnormal or we need to change your treatment, we will call you or send a MyChart message to review the results.  Testing/Procedures: None ordered.  Follow-Up: At [facility], you and your health needs are our priority.  As part of our continuing mission to provide you with exceptional heart care, we have created designated Provider Care Teams.  These Care Teams include your primary Cardiologist (physician) and Advanced Practice Providers (APPs -  Physician Assistants and Nurse Practitioners) who all work together to provide you with the care you need, when you need it.  Your next appointment:   1 year(s)  The format for your next appointment:   In Person  Provider:   [name], MD or one of the following Advanced Practice Providers on your designated Care Team:   [name], [place] [name], [place] Kaitlyn Barrack, NP  Note: Remote monitoring is used to monitor your Pacemaker/ ICD from home. This monitoring reduces the number of office visits required to check  your device to one time per year. It allows us  to keep an eye on the functioning of your device to ensure it is working properly.

## 2024-02-04 ENCOUNTER — Other Ambulatory Visit: Payer: Self-pay | Admitting: Internal Medicine

## 2024-02-04 DIAGNOSIS — I480 Paroxysmal atrial fibrillation: Secondary | ICD-10-CM

## 2024-02-04 NOTE — Telephone Encounter (Signed)
 Prescription refill request for Eliquis  received. Indication:AFIB Last office visit:9/25 Scr:0.98 2024 Age: 88 Weight:85.8  kg  Prescription refilled

## 2024-02-05 ENCOUNTER — Ambulatory Visit (HOSPITAL_COMMUNITY)
Admission: RE | Admit: 2024-02-05 | Discharge: 2024-02-05 | Disposition: A | Discharge status: Home / Self Care | Source: Ambulatory Visit | Attending: Physician Assistant | Admitting: Physician Assistant

## 2024-02-05 DIAGNOSIS — R0600 Dyspnea, unspecified: Secondary | ICD-10-CM | POA: Insufficient documentation

## 2024-02-05 DIAGNOSIS — R0602 Shortness of breath: Secondary | ICD-10-CM | POA: Insufficient documentation

## 2024-02-05 LAB — ECHOCARDIOGRAM COMPLETE
AR max vel: 2.29 cm2
AV Area VTI: 2.38 cm2
AV Area mean vel: 2.3 cm2
AV Mean grad: 3 mmHg
AV Peak grad: 5 mmHg
Ao pk vel: 1.12 m/s
Area-P 1/2: 3.06 cm2
S' Lateral: 2.5 cm

## 2024-02-10 NOTE — Progress Notes (Unsigned)
 Cardiology Office Note   Date:  02/11/2024  ID:  [name], [place] 1935/11/17, MRN [id number] PCP: Ransom Other, MD  [facility] Providers Cardiologist:  Oneil Parchment, MD Electrophysiologist:  Danelle Birmingham, MD   History of Present Illness [name] is a 88 y.o. female with a past medical history [place] for pacemaker, sick sinus syndrome, [employer] pacemaker implanted, PAF, hypertension, NSVT, and fatigue here for follow-up appointment.  Was seen by Dr. Parchment 11/24/2022 and had her pacemaker adjusted.  Low burden of atrial fibrillation.  No bleeding or syncope.  Brief dizziness at times.  Has minor palpitations here and there.  Family history includes mother dying from an MI, brother died with MI, sister atrial fibrillation.  She was added on my schedule today for worsening shortness of breath and 2 episodes of chest pain.  She is here for further workup.  She was seen by me August of this year, and presented with a history of atrial fibrillation and a pacemaker with chest pain and shortness of breath.   Approximately three to three and a half weeks ago, she experienced severe chest pain on the left side of her chest, followed by a second episode shortly after. Since then, she has had continuous shortness of breath, which has not improved. Her breathing pattern has changed to 'shorter breaths' with difficulty taking deep breaths, particularly noticeable during exertion.  She has atrial fibrillation and a pacemaker placed four years ago. Previously well-controlled, she now experiences increased frequency of palpitations over the past two to three weeks, though they remain brief and not daily.  She notes swelling in her legs with a sensation of tight skin, despite stable weight. Her current medication includes Lasix , taken at 8 AM.  No orthopnea, PND.   Discussed the use of AI scribe software for clinical note transcription with the patient, who gave verbal  consent to proceed.  She comes in today for follow-up.  She states that back in July she started to get more short of breath but now is not as bad.  Her shortness of breath tends to come and go.  Her swelling has gone down since the weather has gotten a little bit cooler.  It does flareup from time to time especially with salty food intake.  She has never been on a diet.  She typically eats what ever she wants.  We discussed maintaining a low-sodium, heart healthy diet to help with her fluid status.  We went over her most recent testing including her echocardiogram and her stress test.  Stress test was negative and low risk.  Her echocardiogram showed some grade 1 diastolic dysfunction but normal LVEF and no significant valvular disease that would lead to her shortness of breath.  We have suggested that she be referred to pulmonary for pulmonary function tests and a pulmonary workup for her shortness of breath since there does not seem to be a clear cardiac cause.     Reports no chest pain, pressure, or tightness. No  orthopnea, PND. Reports no palpitations.    ROS: pertinent ROS in HPI  Studies Reviewed     Echo 07/03/22 IMPRESSIONS     1. Left ventricular ejection fraction by 3D volume is 55 %. The left  ventricle has normal function. The left ventricle has no regional wall  motion abnormalities. There is mild concentric left ventricular  hypertrophy. Left ventricular diastolic function  could not be evaluated.   2. Right ventricular systolic function is normal.  The right ventricular  size is normal. There is normal pulmonary artery systolic pressure.   3. The mitral valve is degenerative. Mild mitral valve regurgitation. No  evidence of mitral stenosis.   4. The aortic valve is normal in structure. Aortic valve regurgitation is  not visualized. No aortic stenosis is present.   5. The inferior vena cava is normal in size with greater than 50%  respiratory variability, suggesting right  atrial pressure of 3 mmHg.    Risk Assessment/Calculations  CHA2DS2-VASc Score = 3   This indicates a 3.2% annual risk of stroke. The patient's score is based upon: CHF History: 0 HTN History: 0 Diabetes History: 0 Stroke History: 0 Vascular Disease History: 0 Age Score: 2 Gender Score: 1       Physical Exam VS:  BP 110/60   Pulse 74   Ht 5' 4 (1.626 m)   Wt 183 lb (83 kg)   SpO2 94%   BMI 31.41 kg/m        Wt Readings from Last 3 Encounters:  02/11/24 183 lb (83 kg)  01/29/24 189 lb 1.6 oz (85.8 kg)  12/28/23 186 lb 12.8 oz (84.7 kg)    GEN: Well nourished, well developed in no acute distress NECK: No JVD; No carotid bruits CARDIAC: RRR, no murmurs, rubs, gallops RESPIRATORY:  Clear to auscultation without rales, wheezing or rhonchi  ABDOMEN: Soft, non-tender, non-distended EXTREMITIES:  No edema; No deformity   ASSESSMENT AND PLAN  Shortness of breath and chest pain Severe chest pain and persistent shortness of breath suggest possible cardiac rhythm issues or coronary artery disease. Previous echocardiogram and stress test outdated. - Echocardiogram showed grade 1 DD, normal LVEF, no significant valvular disease -Stress test was low risk with no ischemia - Refer to pulmonary for pulmonary function test and further workup for her SOB - Does not seem to be palpitation driven  Paroxysmal atrial fibrillation and palpitations, status post pacemaker placement Intermittent palpitations have increased but are not severe. Pacemaker functioning well, no atrial fibrillation on EKG. - Recently saw Dr. Waddell and he was satisfied from a rhythm standpoint  Peripheral edema Leg swelling indicates fluid retention. Weight stable, but skin tightness noted. Currently on Lasix  20 mg daily. - Continue daily weights, low-sodium, heart healthy diet - Encourage lower extremity compression if she is going to be on her feet most of the day - Elevate legs as able (typically her  swelling does go down after an overnight elevation)  Hypertension - Monitor blood pressure. - Much better today 110/60, continue current medication regimen      Dispo: The patient preferred to keep her follow-up with Dr. Jeffrie next month.  Signed, Orren LOISE Fabry, PA-C

## 2024-02-10 NOTE — Progress Notes (Signed)
 Remote PPM Transmission

## 2024-02-11 ENCOUNTER — Ambulatory Visit: Attending: Physician Assistant | Admitting: Physician Assistant

## 2024-02-11 ENCOUNTER — Encounter: Payer: Self-pay | Admitting: Physician Assistant

## 2024-02-11 VITALS — BP 110/60 | HR 74 | Ht 64.0 in | Wt 183.0 lb

## 2024-02-11 DIAGNOSIS — I2510 Atherosclerotic heart disease of native coronary artery without angina pectoris: Secondary | ICD-10-CM | POA: Diagnosis not present

## 2024-02-11 DIAGNOSIS — I480 Paroxysmal atrial fibrillation: Secondary | ICD-10-CM

## 2024-02-11 DIAGNOSIS — R600 Localized edema: Secondary | ICD-10-CM

## 2024-02-11 DIAGNOSIS — R0600 Dyspnea, unspecified: Secondary | ICD-10-CM

## 2024-02-11 DIAGNOSIS — R030 Elevated blood-pressure reading, without diagnosis of hypertension: Secondary | ICD-10-CM

## 2024-02-11 DIAGNOSIS — Z950 Presence of cardiac pacemaker: Secondary | ICD-10-CM

## 2024-02-11 DIAGNOSIS — R0602 Shortness of breath: Secondary | ICD-10-CM

## 2024-02-11 DIAGNOSIS — R079 Chest pain, unspecified: Secondary | ICD-10-CM | POA: Diagnosis not present

## 2024-02-11 MED ORDER — FUROSEMIDE 20 MG PO TABS: ORAL_TABLET | ORAL | 3 refills | Status: AC

## 2024-02-11 NOTE — Patient Instructions (Addendum)
 Medication Instructions:  Your physician recommends that you continue on your current medications as directed. Please refer to the Current Medication list given to you today.  *If you need a refill on your cardiac medications before your next appointment, please call your pharmacy*   Follow-Up: At [facility], you and your health needs are our priority.  As part of our continuing mission to provide you with exceptional heart care, our providers are all part of one team.  This team includes your primary Cardiologist (physician) and Advanced Practice Providers or APPs (Physician Assistants and Nurse Practitioners) who all work together to provide you with the care you need, when you need it.   We recommend signing up for the patient portal called MyChart.  Sign up information is provided on this After Visit Summary.  MyChart is used to connect with patients for Virtual Visits (Telemedicine).  Patients are able to view lab/test results, encounter notes, upcoming appointments, etc.  Non-urgent messages can be sent to your provider as well.   To learn more about what you can do with MyChart, go to [email or web address].   Other Instructions Keep upcoming appointment with Dr. Jeffrie scheduled for next month    Low-Sodium Eating Plan Salt (sodium) helps you keep a healthy balance of fluids in your body. Too much sodium can raise your blood pressure. It can also cause fluid and waste to be held in your body. Your health care provider or dietitian may recommend a low-sodium eating plan if you have high blood pressure (hypertension), kidney disease, liver disease, or heart failure. Eating less sodium can help lower your blood pressure and reduce swelling. It can also protect your heart, liver, and kidneys. What are tips for following this plan? Reading food labels  Check food labels for the amount of sodium per serving. If you eat more than one serving, you must multiply the listed amount  by the number of servings. Choose foods with less than 140 milligrams (mg) of sodium per serving. Avoid foods with 300 mg of sodium or more per serving. Always check how much sodium is in a product, even if the label says unsalted or no salt added. Shopping  Buy products labeled as low-sodium or no salt added. Buy fresh foods. Avoid canned foods and pre-made or frozen meals. Avoid canned, cured, or processed meats. Buy breads that have less than 80 mg of sodium per slice. Cooking  Eat more home-cooked food. Try to eat less restaurant, buffet, and fast food. Try not to add salt when you cook. Use salt-free seasonings or herbs instead of table salt or sea salt. Check with your provider or pharmacist before using salt substitutes. Cook with plant-based oils, such as canola, sunflower, or olive oil. Meal planning When eating at a restaurant, ask if your food can be made with less salt or no salt. Avoid dishes labeled as brined, pickled, cured, or smoked. Avoid dishes made with soy sauce, miso, or teriyaki sauce. Avoid foods that have monosodium glutamate (MSG) in them. MSG may be added to some restaurant food, sauces, soups, bouillon, and canned foods. Make meals that can be grilled, baked, poached, roasted, or steamed. These are often made with less sodium. General information Try to limit your sodium intake to 1,500-2,300 mg each day, or the amount told by your provider. What foods should I eat? Fruits Fresh, frozen, or canned fruit. Fruit juice. Vegetables Fresh or frozen vegetables. No salt added canned vegetables. No salt added tomato sauce and paste. Low-sodium  or reduced-sodium tomato and vegetable juice. Grains Low-sodium cereals, such as oats, puffed wheat and rice, and shredded wheat. Low-sodium crackers. Unsalted rice. Unsalted pasta. Low-sodium bread. Whole grain breads and whole grain pasta. Meats and other proteins Fresh or frozen meat, poultry, seafood, and fish.  These should have no added salt. Low-sodium canned tuna and salmon. Unsalted nuts. Dried peas, beans, and lentils without added salt. Unsalted canned beans. Eggs. Unsalted nut butters. Dairy Milk. Soy milk. Cheese that is naturally low in sodium, such as ricotta cheese, fresh mozzarella, or Swiss cheese. Low-sodium or reduced-sodium cheese. Cream cheese. Yogurt. Seasonings and condiments Fresh and dried herbs and spices. Salt-free seasonings. Low-sodium mustard and ketchup. Sodium-free salad dressing. Sodium-free light mayonnaise. Fresh or refrigerated horseradish. Lemon juice. Vinegar. Other foods Homemade, reduced-sodium, or low-sodium soups. Unsalted popcorn and pretzels. Low-salt or salt-free chips. The items listed above may not be all the foods and drinks you can have. Talk to a dietitian to learn more. What foods should I avoid? Vegetables Sauerkraut, pickled vegetables, and relishes. Olives. [place] fries. Onion rings. Regular canned vegetables, except low-sodium or reduced-sodium items. Regular canned tomato sauce and paste. Regular tomato and vegetable juice. Frozen vegetables in sauces. Grains Instant hot cereals. Bread stuffing, pancake, and biscuit mixes. Croutons. Seasoned rice or pasta mixes. Noodle soup cups. Boxed or frozen macaroni and cheese. Regular salted crackers. Self-rising flour. Meats and other proteins Meat or fish that is salted, canned, smoked, spiced, or pickled. Precooked or cured meat, such as sausages or meat loaves. [place]. Ham. Pepperoni. Hot dogs. Corned beef. Chipped beef. Salt pork. Jerky. Pickled herring, anchovies, and sardines. Regular canned tuna. Salted nuts. Dairy Processed cheese and cheese spreads. Hard cheeses. Cheese curds. Blue cheese. Feta cheese. String cheese. Regular cottage cheese. Buttermilk. Canned milk. Fats and oils Salted butter. Regular margarine. Ghee. Bacon fat. Seasonings and condiments Onion salt, garlic salt, seasoned salt, table  salt, and sea salt. Canned and packaged gravies. Worcestershire sauce. Tartar sauce. Barbecue sauce. Teriyaki sauce. Soy sauce, including reduced-sodium soy sauce. Steak sauce. Fish sauce. Oyster sauce. Cocktail sauce. Horseradish that you find on the shelf. Regular ketchup and mustard. Meat flavorings and tenderizers. Bouillon cubes. Hot sauce. Pre-made or packaged marinades. Pre-made or packaged taco seasonings. Relishes. Regular salad dressings. Salsa. Other foods Salted popcorn and pretzels. Corn chips and puffs. Potato and tortilla chips. Canned or dried soups. Pizza. Frozen entrees and pot pies. The items listed above may not be all the foods and drinks you should avoid. Talk to a dietitian to learn more. This information is not intended to replace advice given to you by your health care provider. Make sure you discuss any questions you have with your health care provider. Document Revised: 05/22/2022 Document Reviewed: 05/22/2022 Elsevier Patient Education  2024 [employer].

## 2024-02-22 ENCOUNTER — Ambulatory Visit: Admitting: Cardiology

## 2024-02-23 DIAGNOSIS — M8580 Other specified disorders of bone density and structure, unspecified site: Secondary | ICD-10-CM | POA: Diagnosis not present

## 2024-02-23 DIAGNOSIS — D6869 Other thrombophilia: Secondary | ICD-10-CM | POA: Diagnosis not present

## 2024-02-23 DIAGNOSIS — R7303 Prediabetes: Secondary | ICD-10-CM | POA: Diagnosis not present

## 2024-02-23 DIAGNOSIS — I10 Essential (primary) hypertension: Secondary | ICD-10-CM | POA: Diagnosis not present

## 2024-02-23 DIAGNOSIS — Z0000 Encounter for general adult medical examination without abnormal findings: Secondary | ICD-10-CM | POA: Diagnosis not present

## 2024-02-23 DIAGNOSIS — Z23 Encounter for immunization: Secondary | ICD-10-CM | POA: Diagnosis not present

## 2024-02-23 DIAGNOSIS — I480 Paroxysmal atrial fibrillation: Secondary | ICD-10-CM | POA: Diagnosis not present

## 2024-02-23 DIAGNOSIS — E785 Hyperlipidemia, unspecified: Secondary | ICD-10-CM | POA: Diagnosis not present

## 2024-02-23 DIAGNOSIS — Z1331 Encounter for screening for depression: Secondary | ICD-10-CM | POA: Diagnosis not present

## 2024-02-23 DIAGNOSIS — N1831 Chronic kidney disease, stage 3a: Secondary | ICD-10-CM | POA: Diagnosis not present

## 2024-02-23 DIAGNOSIS — E559 Vitamin D deficiency, unspecified: Secondary | ICD-10-CM | POA: Diagnosis not present

## 2024-02-23 DIAGNOSIS — R5383 Other fatigue: Secondary | ICD-10-CM | POA: Diagnosis not present

## 2024-03-11 ENCOUNTER — Encounter: Payer: Self-pay | Admitting: Internal Medicine

## 2024-03-11 ENCOUNTER — Ambulatory Visit (HOSPITAL_COMMUNITY)
Admission: RE | Admit: 2024-03-11 | Discharge: 2024-03-11 | Disposition: A | Discharge status: Home / Self Care | Source: Ambulatory Visit | Attending: Internal Medicine | Admitting: Internal Medicine

## 2024-03-11 ENCOUNTER — Ambulatory Visit: Admitting: Internal Medicine

## 2024-03-11 VITALS — BP 134/74 | HR 61 | Ht 64.0 in | Wt 188.0 lb

## 2024-03-11 DIAGNOSIS — R058 Other specified cough: Secondary | ICD-10-CM | POA: Insufficient documentation

## 2024-03-11 DIAGNOSIS — R0602 Shortness of breath: Secondary | ICD-10-CM | POA: Diagnosis not present

## 2024-03-11 DIAGNOSIS — R059 Cough, unspecified: Secondary | ICD-10-CM | POA: Diagnosis not present

## 2024-03-11 DIAGNOSIS — R0609 Other forms of dyspnea: Secondary | ICD-10-CM

## 2024-03-11 DIAGNOSIS — R0689 Other abnormalities of breathing: Secondary | ICD-10-CM | POA: Diagnosis not present

## 2024-03-11 MED ORDER — FAMOTIDINE 20 MG PO TABS: ORAL_TABLET | ORAL | 11 refills | Status: DC

## 2024-03-11 MED ORDER — PANTOPRAZOLE SODIUM 40 MG PO TBEC: 40.0000 mg | DELAYED_RELEASE_TABLET | Freq: Every day | ORAL | 2 refills | Status: DC

## 2024-03-11 NOTE — Assessment & Plan Note (Addendum)
 Onset July 2025 not proportionate to activity with neg cards w/u prior to 1st pulmonary ov 03/11/2024 - ECHO 12/28/23  grade 1 diastolic dysfunction  - 03/11/2024   Walked on RA  x  3  lap(s) =  approx 450  ft  @ fast pace, stopped due to end of study  with lowest 02 sats 96%   - max gerd rx 03/11/2024 >>> - PFTs ordered 03/11/2024   Symptoms are markedly disproportionate to objective findings and not clear to what extent this is actually a pulmonary  problem but pt does appear to have difficult to sort out respiratory symptoms of unknown origin for which  DDX  = almost all start with A and  include Adherence, Ace Inhibitors, Acid Reflux, Active Sinus Disease, Alpha 1 Antitripsin deficiency, Anxiety masquerading as Airways dz,  ABPA,  Allergy(esp in young), Aspiration (esp in elderly), Adverse effects of meds,  Active smoking or Vaping, A bunch of PE's/clot burden (a few small clots can't cause this syndrome unless there is already severe underlying pulm or vascular dz with poor reserve),  Anemia or thyroid  disorder, plus two Bs  = Bronchiectasis and Beta blocker use..and one C= CHF    By cross referencing with [facility] the assoc with hoarsess with h/o sinus dx and sob is most suggestive of PNDS/throat clearing/GERD > throat clearing and  all that's needed here is address the cycle of cough and check pfts to complete the w/u  Of the three most common causes of  Sub-acute / recurrent or chronic cough, only one (GERD)  can actually contribute to/ trigger  the other two (asthma and post nasal drip syndrome)  and perpetuate the cylce of cough.  While not intuitively obvious, many patients with chronic low grade reflux do not cough until there is a primary insult that disturbs the protective epithelial barrier and exposes sensitive nerve endings.   This is typically viral but can due to PNDS and  either may apply here.   The point is that once this occurs, it is difficult to eliminate the cycle  using anything but  a maximally effective acid suppression regimen at least in the short run, accompanied by an appropriate diet to address non acid GERD and control / eliminate the cough starting with just hard rock candy to promote swallowing over throat clearing.    Each maintenance medication was reviewed in detail including emphasizing most importantly the difference between maintenance and prns and under what circumstances the prns are to be triggered using an action plan format where appropriate.  Total time for H and P, chart review, counseling,  directly observing portions of ambulatory 02 saturation study/ and generating customized AVS unique to this office visit / same day charting = 61 min new pt  with  multiple chronic   refractory respiratory  symptoms of uncertain etiology

## 2024-03-11 NOTE — Progress Notes (Signed)
 [name], female    DOB: [birth year]    MRN: [id number]   Brief patient profile:  13  yowf  never smoker with onset sinus problems mostly in winter  p moving to  [place] in 1970s  then moved to present location in 2017 s change in symptoms and without eval y  allergy/ent  but developed variable hoarseness x 2023   referred to pulmonary clinic in [place]  03/11/2024 by Orren Fabry  for new onset of sob in July 2025    Pt not previously seen by [facility] service.     History of Present Illness  03/11/2024  Pulmonary/ 1st office eval/ Xaria Judon / [place] [facility]  Chief Complaint  Patient presents with   Establish Care    Referred by cardio for PFT for [facility]   Dyspnea:  shops at [employer] more tired than short of breath and sensation of sob just as likely at rest as with activity  Seems worse p stirring around in am - doesn't typically wake her up in am  Cough:   freq throat clearing daytime only but no mucus production  Sleep: flat bed / 1 pillow  SABA use: none  02: none     No obvious day to day or daytime pattern/variability or assoc excess/ purulent sputum or mucus plugs or hemoptysis or cp or chest tightness, subjective wheeze or overt   hb symptoms.    Also denies any obvious fluctuation of symptoms with weather or environmental changes or other aggravating or alleviating factors except as outlined above   No unusual exposure hx or h/o childhood pna/ asthma or knowledge of premature birth.  Current Allergies, Complete Past Medical History, Past Surgical History, Family History, and Social History were reviewed in [employer] record.  ROS  The following are not active complaints unless bolded Hoarseness, sore throat, dysphagia, dental problems, itching, sneezing,  nasal congestion or discharge of excess mucus or purulent secretions, ear ache,   fever, chills, sweats, unintended wt loss or wt gain, classically pleuritic or exertional cp,  orthopnea pnd or  arm/hand swelling  or leg swelling, presyncope, palpitations, abdominal pain, anorexia, nausea, vomiting, diarrhea  or change in bowel habits or change in bladder habits, change in stools or change in urine, dysuria, hematuria,  rash, arthralgias, visual complaints, headache, numbness, weakness or ataxia or problems with walking or coordination,  change in mood or  memory.            Outpatient Medications Prior to Visit  Medication Sig Dispense Refill   acetaminophen  (TYLENOL ) 500 MG tablet Take 500-1,000 mg by mouth every 6 (six) hours as needed (for pain.).     apixaban  (ELIQUIS ) 5 MG TABS tablet Take 1 tablet by mouth twice daily 180 tablet 1   cholecalciferol (VITAMIN D3) 25 MCG (1000 UNIT) tablet Take by mouth.     furosemide  (LASIX ) 20 MG tablet Take 1 tablet daily. You may take an extra tablet as needed for fluid 90 tablet 3   KLOR-CON M20 20 MEQ tablet Take 20 mEq by mouth daily.      metoprolol  succinate (TOPROL -XL) 50 MG 24 hr tablet Take 1 tablet by mouth twice daily 180 tablet 2   metroNIDAZOLE (METROCREAM) 0.75 % cream Apply 1 application topically every other day.     No facility-administered medications prior to visit.    Past Medical History:  Diagnosis Date   Ankle tendinitis    Breast cancer (HCC)    Breast cancer,  left breast (HCC) 08/09/2012   Gall stones    Hip bursitis    Hypertension    Menorrhagia    Migraine    Osteopenia 03/27/2014   Osteoporosis    Personal history of radiation therapy 2012   Left Breast Cancer   Plantar fasciitis    RLS (restless legs syndrome)    Shortness of breath    once a year- gets checked by Dr.   Colbert incontinence    Trigger finger    CTS      Objective:     BP 134/74   Pulse 61   Ht 5' 4 (1.626 m)   Wt 188 lb (85.3 kg)   SpO2 94% Comment: ra  BMI 32.27 kg/m   SpO2: 94 % (ra) amb wf with classic voice fatigue mod severe  Nl exam/ x throat clearing    HEENT : Oropharynx  clear      Nasal turbinates mod  non-specific edema/ no polyps or purulence    NECK :  without  apparent JVD/ palpable Nodes/TM    LUNGS: no acc muscle use,  Nl contour chest which is clear to A and P bilaterally without cough on insp or exp maneuvers   CV:  RRR  no s3 or murmur or increase in P2, and no edema   ABD:  soft and nontender   MS:  Gait nl   ext warm without deformities Or obvious joint restrictions  calf tenderness, cyanosis or clubbing    SKIN: warm and dry without lesions    NEURO:  alert, approp, nl sensorium with  no motor or cerebellar deficits apparent.    CXR PA and Lateral:   03/11/2024 :    I personally reviewed images and impression is as follows:     No acute findings / no active dz    Assessment   Assessment & Plan Upper airway cough syndrome Onset with hoarseness 2023  - Allergy screen 03/11/2024 >  Eos 0. /  IgE   - Max gerd rx 03/11/2024 >>>   Upper airway cough syndrome (previously labeled PNDS),  is so named because it's frequently impossible to sort out how much is  CR/sinusitis with freq throat clearing (which can be related to primary GERD)   vs  causing  secondary ( extra esophageal)  GERD from wide swings in gastric pressure that occur with throat clearing, often  promoting self use of mint and menthol lozenges that reduce the lower esophageal sphincter tone and exacerbate the problem further in a cyclical fashion.   These are the same pts (now being labeled as having irritable larynx syndrome by some cough centers) who not infrequently have a history of having failed to tolerate ace inhibitors,  dry powder inhalers or biphosphonates or report having atypical/extraesophageal reflux symptoms from LPR (globus, throat clearing)  that don't respond to standard doses of PPI  and are easily confused as having aecopd or asthma flares by even experienced allergists/ pulmonologists (myself included).   >>> rx gerd and eliminated throat clearing with hard rock candy and return with  pfts   DOE (dyspnea on exertion) Onset July 2025 not proportionate to activity with neg cards w/u prior to 1st pulmonary ov 03/11/2024 - ECHO 12/28/23  grade 1 diastolic dysfunction  - 03/11/2024   Walked on RA  x  3  lap(s) =  approx 450  ft  @ fast pace, stopped due to end of study  with lowest 02 sats 96%   -  max gerd rx 03/11/2024 >>> - PFTs ordered 03/11/2024   Symptoms are markedly disproportionate to objective findings and not clear to what extent this is actually a pulmonary  problem but pt does appear to have difficult to sort out respiratory symptoms of unknown origin for which  DDX  = almost all start with A and  include Adherence, Ace Inhibitors, Acid Reflux, Active Sinus Disease, Alpha 1 Antitripsin deficiency, Anxiety masquerading as Airways dz,  ABPA,  Allergy(esp in young), Aspiration (esp in elderly), Adverse effects of meds,  Active smoking or Vaping, A bunch of PE's/clot burden (a few small clots can't cause this syndrome unless there is already severe underlying pulm or vascular dz with poor reserve),  Anemia or thyroid  disorder, plus two Bs  = Bronchiectasis and Beta blocker use..and one C= CHF    By cross referencing with [facility] the assoc with hoarsess with h/o sinus dx and sob is most suggestive of PNDS/throat clearing/GERD > throat clearing and  all that's needed here is address the cycle of cough and check pfts to complete the w/u  Of the three most common causes of  Sub-acute / recurrent or chronic cough, only one (GERD)  can actually contribute to/ trigger  the other two (asthma and post nasal drip syndrome)  and perpetuate the cylce of cough.  While not intuitively obvious, many patients with chronic low grade reflux do not cough until there is a primary insult that disturbs the protective epithelial barrier and exposes sensitive nerve endings.   This is typically viral but can due to PNDS and  either may apply here.   The point is that once this occurs, it is difficult to  eliminate the cycle  using anything but a maximally effective acid suppression regimen at least in the short run, accompanied by an appropriate diet to address non acid GERD and control / eliminate the cough starting with just hard rock candy to promote swallowing over throat clearing.    Each maintenance medication was reviewed in detail including emphasizing most importantly the difference between maintenance and prns and under what circumstances the prns are to be triggered using an action plan format where appropriate.  Total time for H and P, chart review, counseling,  directly observing portions of ambulatory 02 saturation study/ and generating customized AVS unique to this office visit / same day charting = 61 min new pt  with  multiple chronic   refractory respiratory  symptoms of uncertain etiology            AVS  Patient Instructions  Pantoprazole (protonix) 40 mg   Take  30-60 min before first meal of the day and Pepcid (famotidine)  20 mg after supper until return to office - this is the best way to tell whether stomach acid is contributing to your problem.     LPR  (REFLUX)  is an extremely common cause of respiratory symptoms just like yours , many times with no obvious heartburn at all.    It can be treated with medication, but also with lifestyle changes including elevation of the head of your bed (ideally with 6 -8inch blocks under the headboard of your bed),  Smoking cessation, avoidance of late meals, excessive alcohol, and avoid fatty foods, chocolate, peppermint, colas, red wine, and acidic juices such as orange juice.  NO MINT OR MENTHOL PRODUCTS SO NO COUGH DROPS  USE SUGARLESS CANDY INSTEAD (Jolley ranchers or Stover's or Life Savers) or even ice chips will also do -  the key is to swallow to prevent all throat clearing. NO OIL BASED VITAMINS - use powdered substitutes.  Avoid fish oil when coughing.    Please remember to go to the lab department   for your tests - we  will call you with the results when they are available.     Please remember to go to the  x-ray department  @  [facility] for your tests - we will call you with the results when they are available     Please schedule a follow up office visit in 6 weeks, call sooner if needed PFTs same day     Ozell America, MD 03/11/2024

## 2024-03-11 NOTE — Patient Instructions (Addendum)
 Pantoprazole (protonix) 40 mg   Take  30-60 min before first meal of the day and Pepcid (famotidine)  20 mg after supper until return to office - this is the best way to tell whether stomach acid is contributing to your problem.     LPR  (REFLUX)  is an extremely common cause of respiratory symptoms just like yours , many times with no obvious heartburn at all.    It can be treated with medication, but also with lifestyle changes including elevation of the head of your bed (ideally with 6 -8inch blocks under the headboard of your bed),  Smoking cessation, avoidance of late meals, excessive alcohol, and avoid fatty foods, chocolate, peppermint, colas, red wine, and acidic juices such as orange juice.  NO MINT OR MENTHOL PRODUCTS SO NO COUGH DROPS  USE SUGARLESS CANDY INSTEAD (Jolley ranchers or Stover's or Life Savers) or even ice chips will also do - the key is to swallow to prevent all throat clearing. NO OIL BASED VITAMINS - use powdered substitutes.  Avoid fish oil when coughing.    Please remember to go to the lab department   for your tests - we will call you with the results when they are available.     Please remember to go to the  x-ray department  @  [facility] for your tests - we will call you with the results when they are available     Please schedule a follow up office visit in 6 weeks, call sooner if needed PFTs same day

## 2024-03-11 NOTE — Assessment & Plan Note (Addendum)
 Onset with hoarseness 2023  - Allergy screen 03/11/2024 >  Eos 0. /  IgE   - Max gerd rx 03/11/2024 >>>   Upper airway cough syndrome (previously labeled PNDS),  is so named because it's frequently impossible to sort out how much is  CR/sinusitis with freq throat clearing (which can be related to primary GERD)   vs  causing  secondary ( extra esophageal)  GERD from wide swings in gastric pressure that occur with throat clearing, often  promoting self use of mint and menthol lozenges that reduce the lower esophageal sphincter tone and exacerbate the problem further in a cyclical fashion.   These are the same pts (now being labeled as having irritable larynx syndrome by some cough centers) who not infrequently have a history of having failed to tolerate ace inhibitors,  dry powder inhalers or biphosphonates or report having atypical/extraesophageal reflux symptoms from LPR (globus, throat clearing)  that don't respond to standard doses of PPI  and are easily confused as having aecopd or asthma flares by even experienced allergists/ pulmonologists (myself included).   >>> rx gerd and eliminated throat clearing with hard rock candy and return with pfts

## 2024-03-13 LAB — CBC WITH DIFFERENTIAL/PLATELET
Basophils Absolute: 0.1 x10E3/uL (ref 0.0–0.2)
Basos: 1 %
EOS (ABSOLUTE): 0.1 x10E3/uL (ref 0.0–0.4)
Eos: 2 %
Hematocrit: 42.8 % (ref 34.0–46.6)
Hemoglobin: 13.8 g/dL (ref 11.1–15.9)
Immature Grans (Abs): 0 x10E3/uL (ref 0.0–0.1)
Immature Granulocytes: 0 %
Lymphocytes Absolute: 1.8 x10E3/uL (ref 0.7–3.1)
Lymphs: 28 %
MCH: 29.9 pg (ref 26.6–33.0)
MCHC: 32.2 g/dL (ref 31.5–35.7)
MCV: 93 fL (ref 79–97)
Monocytes Absolute: 0.7 x10E3/uL (ref 0.1–0.9)
Monocytes: 10 %
Neutrophils Absolute: 3.7 x10E3/uL (ref 1.4–7.0)
Neutrophils: 58 %
Platelets: 311 x10E3/uL (ref 150–450)
RBC: 4.61 x10E6/uL (ref 3.77–5.28)
RDW: 12.8 % (ref 11.7–15.4)
WBC: 6.4 x10E3/uL (ref 3.4–10.8)

## 2024-03-13 LAB — IGE: IgE (Immunoglobulin E), Serum: 56 [IU]/mL (ref 6–495)

## 2024-03-14 ENCOUNTER — Ambulatory Visit: Payer: Self-pay | Admitting: Internal Medicine

## 2024-03-15 ENCOUNTER — Ambulatory Visit (INDEPENDENT_AMBULATORY_CARE_PROVIDER_SITE_OTHER)

## 2024-03-15 DIAGNOSIS — R058 Other specified cough: Secondary | ICD-10-CM

## 2024-03-15 LAB — PULMONARY FUNCTION TEST
DL/VA % pred: 102 %
DL/VA: 4.15 ml/min/mmHg/L
DLCO cor % pred: 79 %
DLCO cor: 14.7 ml/min/mmHg
DLCO unc % pred: 80 %
DLCO unc: 14.88 ml/min/mmHg
FEF 25-75 Post: 3.46 L/s
FEF 25-75 Pre: 2.14 L/s
FEF2575-%Change-Post: 61 %
FEF2575-%Pred-Post: 315 %
FEF2575-%Pred-Pre: 195 %
FEV1-%Change-Post: 16 %
FEV1-%Pred-Post: 110 %
FEV1-%Pred-Pre: 94 %
FEV1-Post: 1.9 L
FEV1-Pre: 1.64 L
FEV1FVC-%Change-Post: 2 %
FEV1FVC-%Pred-Pre: 120 %
FEV6-%Change-Post: 13 %
FEV6-%Pred-Post: 96 %
FEV6-%Pred-Pre: 85 %
FEV6-Post: 2.12 L
FEV6-Pre: 1.87 L
FEV6FVC-%Pred-Post: 106 %
FEV6FVC-%Pred-Pre: 106 %
FVC-%Change-Post: 13 %
FVC-%Pred-Post: 90 %
FVC-%Pred-Pre: 80 %
FVC-Post: 2.12 L
FVC-Pre: 1.87 L
Post FEV1/FVC ratio: 90 %
Post FEV6/FVC ratio: 100 %
Pre FEV1/FVC ratio: 87 %
Pre FEV6/FVC Ratio: 100 %
RV % pred: 92 %
RV: 2.32 L
TLC % pred: 85 %
TLC: 4.33 L

## 2024-03-15 NOTE — Progress Notes (Signed)
 Called and relayed labs, pt confirmed understanding

## 2024-03-15 NOTE — Progress Notes (Signed)
 Called and relayed cxr, pt confirmed understanding

## 2024-03-15 NOTE — Progress Notes (Signed)
 Full PFT performed today.

## 2024-03-15 NOTE — Patient Instructions (Signed)
 Full PFT performed today.

## 2024-03-18 ENCOUNTER — Ambulatory Visit (INDEPENDENT_AMBULATORY_CARE_PROVIDER_SITE_OTHER)

## 2024-03-18 DIAGNOSIS — I480 Paroxysmal atrial fibrillation: Secondary | ICD-10-CM | POA: Diagnosis not present

## 2024-03-19 LAB — CUP PACEART REMOTE DEVICE CHECK
Battery Remaining Longevity: 126 mo
Battery Remaining Percentage: 100 %
Brady Statistic RA Percent Paced: 72 %
Brady Statistic RV Percent Paced: 11 %
Date Time Interrogation Session: 20251031044600
Lead Channel Impedance Value: 527 Ohm
Lead Channel Impedance Value: 682 Ohm
Lead Channel Pacing Threshold Amplitude: 0.7 V
Lead Channel Pacing Threshold Amplitude: 0.7 V
Lead Channel Pacing Threshold Pulse Width: 0.4 ms
Lead Channel Pacing Threshold Pulse Width: 0.4 ms
Lead Channel Setting Pacing Amplitude: 1.2 V
Lead Channel Setting Pacing Amplitude: 2 V
Lead Channel Setting Pacing Pulse Width: 0.4 ms
Lead Channel Setting Sensing Sensitivity: 2.5 mV
Pulse Gen Serial Number: 942377
Zone Setting Status: 755011

## 2024-03-21 NOTE — Progress Notes (Signed)
 Called and relayed results to pt, pt states she is doing better and doesn't think she needs the Symbicort and confirmed understanding

## 2024-03-22 ENCOUNTER — Other Ambulatory Visit: Payer: Self-pay | Admitting: Internal Medicine

## 2024-03-22 DIAGNOSIS — I480 Paroxysmal atrial fibrillation: Secondary | ICD-10-CM

## 2024-03-22 DIAGNOSIS — I495 Sick sinus syndrome: Secondary | ICD-10-CM

## 2024-03-23 NOTE — Progress Notes (Signed)
 Remote PPM Transmission

## 2024-03-24 ENCOUNTER — Ambulatory Visit: Payer: Self-pay | Admitting: Internal Medicine

## 2024-04-04 ENCOUNTER — Other Ambulatory Visit

## 2024-04-26 ENCOUNTER — Ambulatory Visit: Admitting: Internal Medicine

## 2024-04-26 DIAGNOSIS — R0609 Other forms of dyspnea: Secondary | ICD-10-CM

## 2024-04-26 DIAGNOSIS — R058 Other specified cough: Secondary | ICD-10-CM

## 2024-04-26 NOTE — Progress Notes (Deleted)
 [name], female    DOB: Nov 18, 1935    MRN: [id number]   Brief patient profile:  11  yowf  never smoker with onset sinus problems mostly in winter  p moving to  [place] in 1970s  then moved to present location in 2017 s change in symptoms and without eval y  allergy/ent  but developed variable hoarseness x 2023   referred to pulmonary clinic in [place]  03/11/2024 by Orren Fabry  for new onset of sob in July 2025    Pt not previously seen by [facility] service.     History of Present Illness  03/11/2024  Pulmonary/ 1st office eval/ [name] / [place] Office  Chief Complaint  Patient presents with   Establish Care    Referred by cardio for PFT for [facility]   Dyspnea:  shops at [employer] more tired than short of breath and sensation of sob just as likely at rest as with activity  Seems worse p stirring around in am - doesn't typically wake her up in am  Cough:   freq throat clearing daytime only but no mucus production  Sleep: flat bed / 1 pillow  SABA use: none  02: none Patient Instructions  Pantoprazole  (protonix ) 40 mg   Take  30-60 min before first meal of the day and Pepcid  (famotidine )  20 mg after supper until return to office - this is the best way to tell whether stomach acid is contributing to your problem. GERD diet reviewed, bed blocks rec    - Allergy screen 03/11/2024 >  Eos 0.1 /  IgE  56 - PFTs 03/15/24  WNL p 16% resp to saba s rx prior c/w asthma > rec trial of symbicort 80      04/26/2024  f/u ov/[facility] office/[name] re: Asthma maint on ***  No chief complaint on file.   Dyspnea:  *** Cough: *** Sleeping: ***   resp cc  SABA use: *** 02: ***  Lung cancer screening: ***   No obvious day to day or daytime variability or assoc excess/ purulent sputum or mucus plugs or hemoptysis or cp or chest tightness, subjective wheeze or overt sinus or hb symptoms.    Also denies any obvious fluctuation of symptoms with weather or environmental changes or other  aggravating or alleviating factors except as outlined above   No unusual exposure hx or h/o childhood pna/ asthma or knowledge of premature birth.  Current Allergies, Complete Past Medical History, Past Surgical History, Family History, and Social History were reviewed in [employer] record.  ROS  The following are not active complaints unless bolded Hoarseness, sore throat, dysphagia, dental problems, itching, sneezing,  nasal congestion or discharge of excess mucus or purulent secretions, ear ache,   fever, chills, sweats, unintended wt loss or wt gain, classically pleuritic or exertional cp,  orthopnea pnd or arm/hand swelling  or leg swelling, presyncope, palpitations, abdominal pain, anorexia, nausea, vomiting, diarrhea  or change in bowel habits or change in bladder habits, change in stools or change in urine, dysuria, hematuria,  rash, arthralgias, visual complaints, headache, numbness, weakness or ataxia or problems with walking or coordination,  change in mood or  memory.        No outpatient medications have been marked as taking for the 04/26/24 encounter (Appointment) with Kaitlyn Desire B, MD.            Past Medical History:  Diagnosis Date   Ankle tendinitis    Breast cancer (  HCC)    Breast cancer, left breast (HCC) 08/09/2012   Gall stones    Hip bursitis    Hypertension    Menorrhagia    Migraine    Osteopenia 03/27/2014   Osteoporosis    Personal history of radiation therapy 2012   Left Breast Cancer   Plantar fasciitis    RLS (restless legs syndrome)    Shortness of breath    once a year- gets checked by Dr.   Colbert incontinence    Trigger finger    CTS      Objective:    Wt Readings from Last 3 Encounters:  03/11/24 188 lb (85.3 kg)  02/11/24 183 lb (83 kg)  01/29/24 189 lb 1.6 oz (85.8 kg)      Vital signs reviewed  04/26/2024  - Note at rest 02 sats  ***% on ***   General appearance:    ***         Assessment

## 2024-04-27 DIAGNOSIS — H01004 Unspecified blepharitis left upper eyelid: Secondary | ICD-10-CM | POA: Diagnosis not present

## 2024-04-27 DIAGNOSIS — H26493 Other secondary cataract, bilateral: Secondary | ICD-10-CM | POA: Diagnosis not present

## 2024-04-27 DIAGNOSIS — H01002 Unspecified blepharitis right lower eyelid: Secondary | ICD-10-CM | POA: Diagnosis not present

## 2024-04-27 DIAGNOSIS — H01005 Unspecified blepharitis left lower eyelid: Secondary | ICD-10-CM | POA: Diagnosis not present

## 2024-04-27 DIAGNOSIS — H43813 Vitreous degeneration, bilateral: Secondary | ICD-10-CM | POA: Diagnosis not present

## 2024-04-27 DIAGNOSIS — Z961 Presence of intraocular lens: Secondary | ICD-10-CM | POA: Diagnosis not present

## 2024-04-27 DIAGNOSIS — H01001 Unspecified blepharitis right upper eyelid: Secondary | ICD-10-CM | POA: Diagnosis not present

## 2024-06-03 ENCOUNTER — Ambulatory Visit (INDEPENDENT_AMBULATORY_CARE_PROVIDER_SITE_OTHER): Admitting: Internal Medicine

## 2024-06-03 ENCOUNTER — Encounter: Payer: Self-pay | Admitting: Internal Medicine

## 2024-06-03 VITALS — BP 146/75 | HR 77 | Ht 64.0 in | Wt 186.0 lb

## 2024-06-03 DIAGNOSIS — R0609 Other forms of dyspnea: Secondary | ICD-10-CM

## 2024-06-03 DIAGNOSIS — R058 Other specified cough: Secondary | ICD-10-CM | POA: Diagnosis not present

## 2024-06-03 MED ORDER — BUDESONIDE-FORMOTEROL FUMARATE 80-4.5 MCG/ACT IN AERO: INHALATION_SPRAY | RESPIRATORY_TRACT | 12 refills | Status: AC

## 2024-06-03 MED ORDER — METHYLPREDNISOLONE ACETATE 80 MG/ML IJ SUSP
120.0000 mg | Freq: Once | INTRAMUSCULAR | Status: AC
  Administered 2024-06-03: 120 mg via INTRAMUSCULAR

## 2024-06-03 NOTE — Assessment & Plan Note (Addendum)
 Onset July 2025 not proportionate to activity with neg cards w/u prior to 1st pulmonary ov 03/11/2024 - ECHO 12/28/23  grade 1 diastolic dysfunction  - 03/11/2024   Walked on RA  x  3  lap(s) =  approx 450  ft  @ fast pace, stopped due to end of study  with lowest 02 sats 96%   - Allergy screen 03/11/2024 >  Eos 0.1 /  IgE  56 - max gerd rx 03/11/2024 >>> - PFTs 03/15/24  WNL p 16% resp to saba s rx prior c/w asthma > rec trial of symbicort  80  > started 06/03/2024 p demonstrated hfa technique   She has an atopic background and very may respond to symbicort  80 if she uses the inhaler correctly so rec   >>>> depomedrol 120 mg IM to see gets any short term benift  >>> symbicort  80 2bid and return p 4 weeks to see if any to sustain any benefit p first checking to see if hfa adequate          Each maintenance medication was reviewed in detail including emphasizing most importantly the difference between maintenance and prns and under what circumstances the prns are to be triggered using an action plan format where appropriate.  Total time for H and P, chart review, counseling, reviewing hfa  device(s) and generating customized AVS unique to this office visit / same day charting = 34 min

## 2024-06-03 NOTE — Progress Notes (Signed)
 "   [name], female    DOB: February 14, 1936    MRN: [id number]   Brief patient profile:  66  yowf  never smoker with onset sinus problems mostly in winter  p moving to  [place] in 1970s  then moved to present location in 2017 s change in symptoms and without eval y  allergy/ent  but developed variable hoarseness x 2023   referred to pulmonary clinic in [facility]  03/11/2024 by [name]  for new onset of sob in July 2025        History of Present Illness  03/11/2024  Pulmonary/ 1st office eval/ [name] / Sales Executive Complaint  Patient presents with   Establish Care    Referred by cardio for PFT for [facility]   Dyspnea:  shops at [employer] more tired than short of breath and sensation of sob just as likely at rest as with activity  Seems worse p stirring around in am - doesn't typically wake her up in am  Cough:   freq throat clearing daytime only but no mucus production  Sleep: flat bed / 1 pillow  SABA use: none  02: none  Patient Instructions  Pantoprazole  (protonix ) 40 mg   Take  30-60 min before first meal of the day and Pepcid  (famotidine )  20 mg after supper until return to office - this is the best way to tell whether stomach acid is contributing to your problem.    [facility]  rx  Allergy screen 03/11/2024 >  Eos 0.1 /  IgE  56   - PFTs 03/15/24  WNL p 16% resp to saba s rx prior c/w asthma > rec trial of symbicort   80       06/03/2024  f/u ov/[name] office/[name] re: possible asthma  maint on no rx   Chief Complaint  Patient presents with   Cough    No resp. Symptoms today  F/u   Dyspnea:  vacuuming / shopping ok  Cough: none / nose running clear year round  - wakes up with it feeling dry / no benefit to allergy shots or otcs or gerd rx to date  Sleeping: bed is falt one pillow s resp cc once asleep SABA use: none  02: none    No obvious day to day or daytime variability or assoc excess/ purulent sputum or mucus plugs or hemoptysis or cp or chest tightness,  subjective wheeze or overt  hb symptoms.    Also denies any obvious fluctuation of symptoms with weather or environmental changes or other aggravating or alleviating factors except as outlined above   No unusual exposure hx or h/o childhood pna/ asthma or knowledge of premature birth.  Current Allergies, Complete Past Medical History, Past Surgical History, Family History, and Social History were reviewed in [employer] record.  ROS  The following are not active complaints unless bolded Hoarseness, sore throat, dysphagia, dental problems, itching, sneezing,  nasal congestion or discharge of excess mucus or purulent secretions, ear ache,   fever, chills, sweats, unintended wt loss or wt gain, classically pleuritic or exertional cp,  orthopnea pnd or arm/hand swelling  or leg swelling, presyncope, palpitations, abdominal pain, anorexia, nausea, vomiting, diarrhea  or change in bowel habits or change in bladder habits, change in stools or change in urine, dysuria, hematuria,  rash, arthralgias, visual complaints, headache, numbness, weakness or ataxia or problems with walking or coordination,  change in mood or  memory.  Outpatient Medications Prior to Visit  Medication Sig Dispense Refill   acetaminophen  (TYLENOL ) 500 MG tablet Take 500-1,000 mg by mouth every 6 (six) hours as needed (for pain.).     apixaban  (ELIQUIS ) 5 MG TABS tablet Take 1 tablet by mouth twice daily 180 tablet 1   cholecalciferol (VITAMIN D3) 25 MCG (1000 UNIT) tablet Take by mouth.     famotidine  (PEPCID ) 20 MG tablet One after supper 30 tablet 11   furosemide  (LASIX ) 20 MG tablet Take 1 tablet daily. You may take an extra tablet as needed for fluid 90 tablet 3   KLOR-CON M20 20 MEQ tablet Take 20 mEq by mouth daily.      metoprolol  succinate (TOPROL -XL) 50 MG 24 hr tablet Take 1 tablet by mouth twice daily 180 tablet 3   metroNIDAZOLE (METROCREAM) 0.75 % cream Apply 1 application topically  every other day.     pantoprazole  (PROTONIX ) 40 MG tablet Take 1 tablet (40 mg total) by mouth daily. Take 30-60 min before first meal of the day 30 tablet 2   No facility-administered medications prior to visit.          Past Medical History:  Diagnosis Date   Ankle tendinitis    Breast cancer (HCC)    Breast cancer, left breast (HCC) 08/09/2012   Gall stones    Hip bursitis    Hypertension    Menorrhagia    Migraine    Osteopenia 03/27/2014   Osteoporosis    Personal history of radiation therapy 2012   Left Breast Cancer   Plantar fasciitis    RLS (restless legs syndrome)    Shortness of breath    once a year- gets checked by Dr.   Colbert incontinence    Trigger finger    CTS      Objective:     Wt Readings from Last 3 Encounters:  06/03/24 186 lb (84.4 kg)  03/11/24 188 lb (85.3 kg)  02/11/24 183 lb (83 kg)      Vital signs reviewed  06/03/2024  - Note at rest 02 sats  94% on RA    General appearance:    hoarse amb somber wf   nad    HEENT : Oropharynx  clear      Nasal turbinates mod edema with mild crusting but no purulence or polyps    NECK :  without  apparent JVD/ palpable Nodes/TM    LUNGS: no acc muscle use,  Nl contour chest which is clear to A and P bilaterally without cough on insp or exp maneuvers   CV:  RRR  no s3 or murmur or increase in P2, and no edema   ABD:  soft and nontender   MS:  Gait nl   ext warm without deformities Or obvious joint restrictions  calf tenderness, cyanosis or clubbing    SKIN: warm and dry without lesions    NEURO:  alert, approp, nl sensorium with  no motor or cerebellar deficits apparent.     Assessment   Assessment & Plan Upper airway cough syndrome Onset with hoarseness 2023  - Allergy screen 03/11/2024 >  Eos 0.1 /  IgE  56 - Max gerd rx 03/11/2024 >>> no better p one month so stopped   >>>  Offered to refer back to allergy or ENT but declined for now  DOE (dyspnea on exertion) Onset July 2025  not proportionate to activity with neg cards w/u prior to 1st pulmonary ov 03/11/2024 - ECHO  12/28/23  grade 1 diastolic dysfunction  - 03/11/2024   Walked on RA  x  3  lap(s) =  approx 450  ft  @ fast pace, stopped due to end of study  with lowest 02 sats 96%   - Allergy screen 03/11/2024 >  Eos 0.1 /  IgE  56 - max gerd rx 03/11/2024 >>> - PFTs 03/15/24  WNL p 16% resp to saba s rx prior c/w asthma > rec trial of symbicort  80  > started 06/03/2024 p demonstrated hfa technique   She has an atopic background and very may respond to symbicort  80 if she uses the inhaler correctly so rec   >>>> depomedrol 120 mg IM to see gets any short term benift  >>> symbicort  80 2bid and return p 4 weeks to see if any to sustain any benefit p first checking to see if hfa adequate          Each maintenance medication was reviewed in detail including emphasizing most importantly the difference between maintenance and prns and under what circumstances the prns are to be triggered using an action plan format where appropriate.  Total time for H and P, chart review, counseling, reviewing hfa  device(s) and generating customized AVS unique to this office visit / same day charting = 34 min          AVS  Patient Instructions  Symbicort  80 (or Breyna ) Take 2 puffs first thing in am and then another 2 puffs about 12 hours later.   Work on inhaler technique:  relax and gently blow all the way out then take a nice smooth full deep breath back in, triggering the inhaler at same time you start breathing in.  Hold breath in for at least  5 seconds if you can. Blow out symbicort   thru nose. Rinse and gargle with water when done.  If mouth or throat bother you at all,  try brushing teeth/gums/tongue with arm and hammer toothpaste/ make a slurry and gargle and spit out.   >>>  Remember how golfers warm up by taking practice swings - do this with an empty inhaler   Depomedrol 120 mg IM   Please schedule a follow up  office visit in 4 weeks, sooner if needed  with all RESPIRATORY medications /inhalers/ solutions in hand so we can verify exactly what you are taking. This includes all medications from all doctors and over the counters          Ozell America, MD 06/03/2024           "

## 2024-06-03 NOTE — Assessment & Plan Note (Addendum)
 Onset with hoarseness 2023  - Allergy screen 03/11/2024 >  Eos 0.1 /  IgE  56 - Max gerd rx 03/11/2024 >>> no better p one month so stopped   >>>  Offered to refer back to allergy or ENT but declined for now

## 2024-06-03 NOTE — Patient Instructions (Signed)
 Symbicort  80 (or Breyna ) Take 2 puffs first thing in am and then another 2 puffs about 12 hours later.   Work on inhaler technique:  relax and gently blow all the way out then take a nice smooth full deep breath back in, triggering the inhaler at same time you start breathing in.  Hold breath in for at least  5 seconds if you can. Blow out symbicort   thru nose. Rinse and gargle with water when done.  If mouth or throat bother you at all,  try brushing teeth/gums/tongue with arm and hammer toothpaste/ make a slurry and gargle and spit out.   >>>  Remember how golfers warm up by taking practice swings - do this with an empty inhaler   Depomedrol 120 mg IM   Please schedule a follow up office visit in 4 weeks, sooner if needed  with all RESPIRATORY medications /inhalers/ solutions in hand so we can verify exactly what you are taking. This includes all medications from all doctors and over the counters

## 2024-06-17 ENCOUNTER — Ambulatory Visit

## 2024-06-17 DIAGNOSIS — I495 Sick sinus syndrome: Secondary | ICD-10-CM | POA: Diagnosis not present

## 2024-06-17 LAB — CUP PACEART REMOTE DEVICE CHECK
Battery Remaining Longevity: 126 mo
Battery Remaining Percentage: 100 %
Brady Statistic RA Percent Paced: 68 %
Brady Statistic RV Percent Paced: 8 %
Date Time Interrogation Session: 20260130053800
Implantable Lead Connection Status: 753985
Implantable Lead Connection Status: 753985
Implantable Lead Implant Date: 20210805
Implantable Lead Implant Date: 20210805
Implantable Lead Location: 753859
Implantable Lead Location: 753860
Implantable Lead Model: 7841
Implantable Lead Model: 7842
Implantable Lead Serial Number: 1049950
Implantable Lead Serial Number: 1083642
Implantable Pulse Generator Implant Date: 20210805
Lead Channel Impedance Value: 590 Ohm
Lead Channel Impedance Value: 777 Ohm
Lead Channel Pacing Threshold Amplitude: 0.7 V
Lead Channel Pacing Threshold Amplitude: 0.7 V
Lead Channel Pacing Threshold Pulse Width: 0.4 ms
Lead Channel Pacing Threshold Pulse Width: 0.4 ms
Lead Channel Setting Pacing Amplitude: 1.2 V
Lead Channel Setting Pacing Amplitude: 2 V
Lead Channel Setting Pacing Pulse Width: 0.4 ms
Lead Channel Setting Sensing Sensitivity: 2.5 mV
Pulse Gen Serial Number: 942377
Zone Setting Status: 755011

## 2024-06-21 NOTE — Progress Notes (Signed)
 Remote PPM Transmission

## 2024-07-07 ENCOUNTER — Ambulatory Visit: Admitting: Internal Medicine

## 2024-09-16 ENCOUNTER — Ambulatory Visit

## 2024-12-16 ENCOUNTER — Ambulatory Visit

## 2025-03-17 ENCOUNTER — Ambulatory Visit
